# Patient Record
Sex: Male | Born: 1960 | Race: White | Hispanic: No | Marital: Married | Smoking: Never smoker
Health system: Southern US, Community
[De-identification: ages and names within clinical notes are randomized; demographics above are authoritative.]

## PROBLEM LIST (undated history)

## (undated) DIAGNOSIS — I10 Essential (primary) hypertension: Secondary | ICD-10-CM

## (undated) DIAGNOSIS — E7800 Pure hypercholesterolemia, unspecified: Secondary | ICD-10-CM

## (undated) DIAGNOSIS — I2510 Atherosclerotic heart disease of native coronary artery without angina pectoris: Secondary | ICD-10-CM

## (undated) DIAGNOSIS — E785 Hyperlipidemia, unspecified: Secondary | ICD-10-CM

## (undated) HISTORY — DX: Hyperlipidemia, unspecified: E78.5

## (undated) HISTORY — DX: Atherosclerotic heart disease of native coronary artery without angina pectoris: I25.10

---

## 2016-04-15 ENCOUNTER — Institutional Professional Consult (permissible substitution): Payer: Self-pay | Admitting: Internal Medicine

## 2016-05-05 ENCOUNTER — Encounter: Payer: Self-pay | Admitting: Internal Medicine

## 2016-05-05 ENCOUNTER — Ambulatory Visit (INDEPENDENT_AMBULATORY_CARE_PROVIDER_SITE_OTHER): Payer: Managed Care, Other (non HMO) | Admitting: Internal Medicine

## 2016-05-05 VITALS — BP 124/88 | HR 105 | Ht 66.0 in | Wt 178.8 lb

## 2016-05-05 DIAGNOSIS — R053 Chronic cough: Secondary | ICD-10-CM

## 2016-05-05 DIAGNOSIS — R05 Cough: Secondary | ICD-10-CM

## 2016-05-05 NOTE — Patient Instructions (Addendum)
Pantoprazole (protonix) 40 mg   Take  30-60 min before first meal of the day and Pepcid (famotidine)  20 mg one @  bedtime until gone for a week   GERD (REFLUX)  is an extremely common cause of respiratory symptoms just like yours , many times with no obvious heartburn at all.    It can be treated with medication, but also with lifestyle changes including elevation of the head of your bed (ideally with 6 inch  bed blocks),  Smoking cessation, avoidance of late meals, excessive alcohol, and avoid fatty foods, chocolate, peppermint, colas, red wine, and acidic juices such as orange juice.  NO MINT OR MENTHOL PRODUCTS SO NO COUGH DROPS  USE SUGARLESS CANDY INSTEAD (Jolley ranchers or Stover's or Life Savers) or even ice chips will also do - the key is to swallow to prevent all throat clearing. NO OIL BASED VITAMINS - use powdered substitutes.    Please see patient coordinator before you leave today  to schedule HRCT of chest Despina Hick/sinus

## 2016-05-05 NOTE — Progress Notes (Signed)
   Subjective:    Patient ID: Javier Rowland, male    DOB: 08-Jul-1961,    MRN: [id number]  HPI  [age] yowm never smoker  [place] ran middle of the [place] and always passed sp[facility]rom[facility] resp[facility] but developed chronic cough in 2016 with tendency to "bad cold" with crackles on exam per Javier Rowland Rowland who referred pt to pulmonary clinic 05/05/2016    05/05/2016 1st [facility] office visit/ Javier Rowland   Chief Complaint  Patient presents with  . Pulmonary Consult    Referred by Dr. Kateri PlummerMorrow for eval of abnormal lung sounds. Pt states that his PCP notices a "rattle" on exam. Pt states he has had a cough for the past 3-4 wks. His spouse reports that his cough has been present for longer than 3-4 wks.   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  Cough / throat clearing more day than noct/ sporadic but present most days x one year per spouse   No obvious day to day or daytime variability or assoc excess/ purulent sputum or mucus plugs or hemoptysis or cp or chest tightness, subjective wheeze or overt sinus or hb symptoms. No unusual exp hx or h/o childhood pna/ asthma or knowledge of premature birth.  Sleeping ok without nocturnal  or early am exacerbation  of respiratory  c/o's or need for noct saba. Also denies any obvious fluctuation of symptoms with weather or environmental changes or other aggravating or alleviating factors except as outlined above   Current Medications, Allergies, Complete Past Medical History, Past Surgical History, Family History, and Social History were reviewed in [facility] electronic medical record.        Review of Systems  Constitutional: Negative for fever, chills, activity change, appetite change and unexpected weight change.  HENT: Negative for congestion, dental problem, postnasal drip,  rhinorrhea, sneezing, sore throat, trouble swallowing and voice change.   Eyes: Negative for visual disturbance.  Respiratory: Positive for cough. Negative for choking and shortness of breath.   Cardiovascular: Negative for chest pain and leg swelling.  Gastrointestinal: Negative for nausea, vomiting and abdominal pain.  Genitourinary: Negative for difficulty urinating.  Musculoskeletal: Negative for arthralgias.  Skin: Negative for rash.  Psychiatric/Behavioral: Negative for behavioral problems and confusion.       Objective:   Physical Exam  amb hoarse wm nad   Wt Readings from Last 3 Encounters:  05/05/16 178 lb 12.8 oz (81.103 kg)    Vital signs reviewed   HEENT: nl dentition, turbinates, and oropharynx. Nl external ear canals without cough reflex   NECK :  without JVD/Nodes/TM/ nl carotid upstrokes bilaterally   LUNGS: no acc muscle use,  Nl contour chest with subtle late insp crackles  bilaterally without cough on insp or exp maneuvers   CV:  RRR  no s3 or murmur or increase in P2, no edema   ABD:  soft and nontender with nl inspiratory excursion in the supine position. No bruits or organomegaly, bowel sounds nl  MS:  Nl gait/ ext warm without deformities, calf tenderness, cyanosis or clubbing No obvious joint restrictions   SKIN: warm and dry without lesions    NEURO:  alert, approp, nl sensorium with  no motor deficits            Assessment & Plan:

## 2016-05-06 ENCOUNTER — Telehealth: Payer: Self-pay | Admitting: Internal Medicine

## 2016-05-06 MED ORDER — PANTOPRAZOLE SODIUM 40 MG PO TBEC: 40.0000 mg | DELAYED_RELEASE_TABLET | Freq: Every day | ORAL | Status: DC

## 2016-05-06 MED ORDER — FAMOTIDINE 20 MG PO TABS: 20.0000 mg | ORAL_TABLET | Freq: Two times a day (BID) | ORAL | Status: DC

## 2016-05-06 NOTE — Telephone Encounter (Signed)
Sue Lushndrea called back and requested that we also cancel the original appointment for the CT scan on the patient's behalf. She tried to cancel and she was on hold for 30 minutes.-prm

## 2016-05-06 NOTE — Telephone Encounter (Signed)
CT orders have been faxed to [facility]. Spoke with Kennyth ArnoldStacy at [facility] and these appointments have been canceled. Nothing further was needed.

## 2016-05-06 NOTE — Telephone Encounter (Signed)
Spoke with pt. The number we have on file for pt's wife is incorrect. Advised pt that we will send in these prescriptions for him. Nothing further was needed.

## 2016-05-06 NOTE — Assessment & Plan Note (Addendum)
Assoc with subtle crackles on exam is worrisome for ILD and note he has had some asbestos exp in past but this was really minimal and the cough is not reproduced on insp which favors Upper airway cough syndrome, so named because it's frequently impossible to sort out how much is  CR/sinusitis with freq throat clearing (which can be related to primary GERD)   vs  causing  secondary (" extra esophageal")  GERD from wide swings in gastric pressure that occur with throat clearing, often  promoting self use of mint and menthol lozenges that reduce the lower esophageal sphincter tone and exacerbate the problem further in a cyclical fashion.   These are the same pts (now being labeled as having "irritable larynx syndrome" by some cough centers) who not infrequently have a history of having failed to tolerate ace inhibitors,  dry powder inhalers or biphosphonates or report having atypical reflux symptoms that don't respond to standard doses of PPI , and are easily confused as having aecopd or asthma flares by even experienced allergists/ pulmonologists.   Will rec rx for gerd and w/u for sinus dz/ILD with ct of chest and sinus  Then regroup with full pfts if ild is present or cough not gone with gerd rx  Total time devoted to counseling  = 35/7469m review case with pt/ discussion of options/alternatives/ personally creating written instructions  in presence of pt  then going over those specific  Instructions directly with the pt including how to use all of the meds but in particular covering each new medication in detail and the difference between the maintenance/automatic meds and the prns using an action plan format for the latter.

## 2016-05-13 ENCOUNTER — Other Ambulatory Visit: Payer: Managed Care, Other (non HMO)

## 2016-05-13 ENCOUNTER — Inpatient Hospital Stay: Admission: RE | Admit: 2016-05-13 | Payer: Managed Care, Other (non HMO) | Source: Ambulatory Visit

## 2016-05-21 ENCOUNTER — Telehealth: Payer: Self-pay | Admitting: Internal Medicine

## 2016-05-21 NOTE — Telephone Encounter (Signed)
Pt calling for CT results. [facility] [place] [facility] [place]. This was done on 05/13/16.  Please advise Verlon AuLeslie if you have seen any results on this CT - if not we will have to call and have them fax these. Thanks.

## 2016-05-22 NOTE — Telephone Encounter (Signed)
No, I have not seen these results

## 2016-05-22 NOTE — Telephone Encounter (Signed)
Called [facility] in [place] and spoke with [place] . Records are being faxed to [phone]. She voiced understanding and had no further questions .

## 2016-05-25 NOTE — Telephone Encounter (Signed)
Dr. Sherene SiresWert, have you received CT results on this patient? They were to be refaxed to us on 05/22/16.  Please advise.

## 2016-05-25 NOTE — Telephone Encounter (Signed)
Spoke with pt's wife. She is aware of results. Nothing further was needed. 

## 2016-05-25 NOTE — Telephone Encounter (Signed)
I did see them and they are unremarkable, needs f/u if not doing better with all active meds in hand

## 2016-06-03 ENCOUNTER — Encounter: Payer: Self-pay | Admitting: Internal Medicine

## 2017-11-01 ENCOUNTER — Encounter (HOSPITAL_COMMUNITY): Payer: Self-pay | Admitting: Emergency Medicine

## 2017-11-01 ENCOUNTER — Other Ambulatory Visit: Payer: Self-pay

## 2017-11-01 ENCOUNTER — Emergency Department (HOSPITAL_COMMUNITY): Payer: Managed Care, Other (non HMO)

## 2017-11-01 ENCOUNTER — Inpatient Hospital Stay (HOSPITAL_COMMUNITY)
Admission: EM | Admit: 2017-11-01 | Discharge: 2017-11-03 | DRG: 247 | Disposition: A | Discharge status: Home / Self Care | Payer: Managed Care, Other (non HMO) | Attending: Internal Medicine | Admitting: Internal Medicine

## 2017-11-01 DIAGNOSIS — I10 Essential (primary) hypertension: Secondary | ICD-10-CM | POA: Diagnosis present

## 2017-11-01 DIAGNOSIS — I2510 Atherosclerotic heart disease of native coronary artery without angina pectoris: Secondary | ICD-10-CM | POA: Diagnosis present

## 2017-11-01 DIAGNOSIS — Z8249 Family history of ischemic heart disease and other diseases of the circulatory system: Secondary | ICD-10-CM

## 2017-11-01 DIAGNOSIS — Z955 Presence of coronary angioplasty implant and graft: Secondary | ICD-10-CM | POA: Diagnosis not present

## 2017-11-01 DIAGNOSIS — E785 Hyperlipidemia, unspecified: Secondary | ICD-10-CM | POA: Diagnosis present

## 2017-11-01 DIAGNOSIS — Z79899 Other long term (current) drug therapy: Secondary | ICD-10-CM

## 2017-11-01 DIAGNOSIS — R05 Cough: Secondary | ICD-10-CM | POA: Diagnosis present

## 2017-11-01 DIAGNOSIS — K219 Gastro-esophageal reflux disease without esophagitis: Secondary | ICD-10-CM | POA: Diagnosis present

## 2017-11-01 DIAGNOSIS — Z7982 Long term (current) use of aspirin: Secondary | ICD-10-CM

## 2017-11-01 DIAGNOSIS — I214 Non-ST elevation (NSTEMI) myocardial infarction: Secondary | ICD-10-CM | POA: Diagnosis present

## 2017-11-01 HISTORY — DX: Essential (primary) hypertension: I10

## 2017-11-01 HISTORY — DX: Pure hypercholesterolemia, unspecified: E78.00

## 2017-11-01 LAB — CBC
HEMATOCRIT: 44 % (ref 39.0–52.0)
Hemoglobin: 15 g/dL (ref 13.0–17.0)
MCH: 30.9 pg (ref 26.0–34.0)
MCHC: 34.1 g/dL (ref 30.0–36.0)
MCV: 90.7 fL (ref 78.0–100.0)
Platelets: 241 10*3/uL (ref 150–400)
RBC: 4.85 MIL/uL (ref 4.22–5.81)
RDW: 12.5 % (ref 11.5–15.5)
WBC: 8 10*3/uL (ref 4.0–10.5)

## 2017-11-01 LAB — BASIC METABOLIC PANEL
Anion gap: 9 (ref 5–15)
BUN: 15 mg/dL (ref 6–20)
CHLORIDE: 104 mmol/L (ref 101–111)
CO2: 26 mmol/L (ref 22–32)
CREATININE: 0.99 mg/dL (ref 0.61–1.24)
Calcium: 8.9 mg/dL (ref 8.9–10.3)
GFR calc Af Amer: >60 mL/min (ref >60)
GFR calc non Af Amer: >60 mL/min (ref >60)
Glucose, Bld: 94 mg/dL (ref 65–99)
POTASSIUM: 3.7 mmol/L (ref 3.5–5.1)
SODIUM: 139 mmol/L (ref 135–145)

## 2017-11-01 LAB — PROTIME-INR
INR: 0.96
Prothrombin Time: 12.7 seconds (ref 11.4–15.2)

## 2017-11-01 LAB — I-STAT TROPONIN, ED: Troponin i, poc: 0.5 ng/mL (ref 0.00–0.08)

## 2017-11-01 LAB — APTT: APTT: 31 s (ref 24–36)

## 2017-11-01 MED ORDER — HEPARIN (PORCINE) IN NACL 100-0.45 UNIT/ML-% IJ SOLN
900.0000 [IU]/h | INTRAMUSCULAR | Status: DC
  Administered 2017-11-01: 900 [IU]/h via INTRAVENOUS
  Filled 2017-11-01: qty 250

## 2017-11-01 MED ORDER — ASPIRIN 81 MG PO CHEW
324.0000 mg | CHEWABLE_TABLET | Freq: Once | ORAL | Status: AC
  Administered 2017-11-01: 324 mg via ORAL
  Filled 2017-11-01: qty 4

## 2017-11-01 MED ORDER — HEPARIN BOLUS VIA INFUSION
4000.0000 [IU] | INTRAVENOUS | Status: AC
  Administered 2017-11-01: 4000 [IU] via INTRAVENOUS
  Filled 2017-11-01: qty 4000

## 2017-11-01 NOTE — ED Notes (Signed)
Bed: WA15 Expected date:  Expected time:  Means of arrival:  Comments: hold 

## 2017-11-01 NOTE — ED Triage Notes (Signed)
Pt c/o intermittent chest pain x 3 weeks, pt states he has chest pain 3-4 times per day and states he has bilateral upper extremity pain associated with the chest pain. Pt describes the chest pain as sharp shooting pains across his chest from R axilla to L axilla. Pt denies SOB. Pt has taken ibuprofen for pain, takes ASA daily.

## 2017-11-01 NOTE — H&P (Signed)
ADMISSION HISTORY & PHYSICAL  Patient Name: Javier Rowland Date of Encounter: 11/01/2017 Cardiologist: None (NEW)  Chief Complaint   Chest pain  Patient Profile   56 yo male with a history of HTN, dyslipidemia and GERD, presents with several weeks of intermittnet SSCP lasting <5 minutes, found to have an elevated troponin.  HPI   Javier Rowland is a 56 y.o. male who is being seen today for the evaluation of chest pain at the request of Dr. Zenia Resides. This is a 56 yo male with a history of HTN, dyslipidemia and GERD, who presents with several weeks of intermittnet SSCP lasting <5 minutes. He works in maintenance and reports that approximately 3 weeks ago he had an episode of intense substernal chest pain which radiated to both arms and back to his back.  He said it lasted for about 5-10 minutes and then improved.  He distinctly thought it may be a heart attack however he said he is "ready to die any time".  He seemed more concerned about the cost of healthcare, but made it a point that his wife brought him into the emergency department as he is continue to have intermittent episodes of chest pain.  On admission, blood work showed an elevated troponin of 0.5.  EKG was personally reviewed and shows borderline anterolateral ST elevation with T wave inversions suggestive of subacute anterior MI.  Currently he is chest pain-free.  He denies any shortness of breath or decrease in exercise tolerance.  PMHx   Past Medical History:  Diagnosis Date  . HTN (hypertension) 11/01/2017  . Hypercholesterolemia   . Hyperlipidemia     History reviewed. No pertinent surgical history.  FAMHx   Family History  Problem Relation Age of Onset  . Allergies Daughter   . Heart disease Maternal Grandfather   . Colon cancer Father     SOCHx    reports that  has never smoked. he has never used smokeless tobacco. He reports that he does not drink alcohol or use drugs.  Outpatient Medications   No current  facility-administered medications on file prior to encounter.    Current Outpatient Medications on File Prior to Encounter  Medication Sig Dispense Refill  . aspirin 81 MG tablet Take 81 mg by mouth daily.    Marland Kitchen ibuprofen (ADVIL,MOTRIN) 200 MG tablet Take 400 mg by mouth every 6 (six) hours as needed for moderate pain.    . naproxen sodium (ALEVE) 220 MG tablet Take 440 mg by mouth 2 (two) times daily as needed (pain).    . pantoprazole (PROTONIX) 40 MG tablet Take 1 tablet (40 mg total) by mouth daily. (Patient taking differently: Take 40 mg by mouth daily as needed (acid reflux.). ) 30 tablet 5  . simvastatin (ZOCOR) 20 MG tablet Take 20 mg by mouth daily.  2  . famotidine (PEPCID) 20 MG tablet Take 1 tablet (20 mg total) by mouth 2 (two) times daily. (Patient not taking: Reported on 11/01/2017) 30 tablet 5    Inpatient Medications    Scheduled Meds:   Continuous Infusions:   PRN Meds:    ALLERGIES   No Known Allergies  ROS   Pertinent items noted in HPI and remainder of comprehensive ROS otherwise negative.  Vitals   Vitals:   11/01/17 1249 11/01/17 1255 11/01/17 1256  BP: 114/82    Pulse: 70    Resp: 18    Temp: 97.6 F (36.4 C)    TempSrc: Oral    SpO2: 99%  100%   Weight:   165 lb (74.8 kg)  Height:   _0  (1.676 m)   No intake or output data in the 24 hours ending 11/01/17 1453 Filed Weights   11/01/17 1256  Weight: 165 lb (74.8 kg)    Physical Exam   General appearance: alert, no distress and Thin Neck: no carotid bruit, no JVD and thyroid not enlarged, symmetric, no tenderness/mass/nodules Lungs: clear to auscultation bilaterally Heart: regular rate and rhythm, S1, S2 normal, no murmur, click, rub or gallop Abdomen: soft, non-tender; bowel sounds normal; no masses,  no organomegaly Extremities: extremities normal, atraumatic, no cyanosis or edema Pulses: 2+ and symmetric Skin: Pale, warm, dry Neurologic: Grossly normal Psych: Pleasant  Labs    Results for orders placed or performed during the hospital encounter of 11/01/17 (from the past 48 hour(s))  Basic metabolic panel     Status: None   Collection Time: 11/01/17  1:05 PM  Result Value Ref Range   Sodium 139 135 - 145 mmol/L   Potassium 3.7 3.5 - 5.1 mmol/L   Chloride 104 101 - 111 mmol/L   CO2 26 22 - 32 mmol/L   Glucose, Bld 94 65 - 99 mg/dL   BUN 15 6 - 20 mg/dL   Creatinine, Ser 0.99 0.61 - 1.24 mg/dL   Calcium 8.9 8.9 - 10.3 mg/dL   GFR calc non Af Amer >60 >60 mL/min   GFR calc Af Amer >60 >60 mL/min    Comment: (NOTE) The eGFR has been calculated using the CKD EPI equation. This calculation has not been validated in all clinical situations. eGFR's persistently <60 mL/min signify possible Chronic Kidney Disease.    Anion gap 9 5 - 15  CBC     Status: None   Collection Time: 11/01/17  1:05 PM  Result Value Ref Range   WBC 8.0 4.0 - 10.5 K/uL   RBC 4.85 4.22 - 5.81 MIL/uL   Hemoglobin 15.0 13.0 - 17.0 g/dL   HCT 44.0 39.0 - 52.0 %   MCV 90.7 78.0 - 100.0 fL   MCH 30.9 26.0 - 34.0 pg   MCHC 34.1 30.0 - 36.0 g/dL   RDW 12.5 11.5 - 15.5 %   Platelets 241 150 - 400 K/uL  I-stat troponin, ED     Status: Abnormal   Collection Time: 11/01/17  1:17 PM  Result Value Ref Range   Troponin i, poc 0.50 (HH) 0.00 - 0.08 ng/mL   Comment NOTIFIED PHYSICIAN    Comment 3            Comment: Due to the release kinetics of cTnI, a negative result within the first hours of the onset of symptoms does not rule out myocardial infarction with certainty. If myocardial infarction is still suspected, repeat the test at appropriate intervals.     ECG   Sinus rhythm at 71 with anteroseptal infarct pattern, indeterminate age - lateral TWI's - Personally Reviewed  Telemetry   Sinus rhythm - Personally Reviewed  Radiology   No results found.  Cardiac Studies   N/A  Impression   Principal Problem:   NSTEMI (non-ST elevated myocardial infarction) ([place]) Active  Problems:   HTN (hypertension)   Dyslipidemia   GERD (gastroesophageal reflux disease)   Recommendation   1. Mr. Veley may have had a missed ST elevation MI a couple of weeks ago and has had stuttering chest pain symptoms, with subacute anterolateral EKG changes suggestive of LAD ischemia.  Troponin was elevated on  admission.  He is currently chest pain-free.  I am recommending heart catheterization.  He will need transfer to [facility] and we will plan to arrange it for tomorrow. Start IV heparin - continue aspirin, add low-dose b-blocker and change simvastatin 20 mg to atorvastatin 80 mg daily. Ok to eat today - keep NPO p MN for cath tomorrow.  Time Spent Directly with Patient:  I have spent a total of 45 minutes with the patient reviewing hospital notes, telemetry, EKGs, labs and examining the patient as well as establishing an assessment and plan that was discussed personally with the patient. > 50% of time was spent in direct patient care.  Length of Stay:  LOS: 0 days   Pixie Casino, MD, [facility], [place] Director of the Advanced Lipid Disorders &  Cardiovascular Risk Reduction Clinic Attending Cardiologist  Direct Dial: [phone]  Fax: [phone]  Website:  [email or web address] Osgood Rowland Ericsson 11/01/2017, 2:53 PM

## 2017-11-01 NOTE — ED Notes (Signed)
[facility] contacted for transport to [place]  °

## 2017-11-01 NOTE — Interval H&P Note (Signed)
Cath Lab Visit (complete for each Cath Lab visit)  Clinical Evaluation Leading to the Procedure:   ACS: Yes.    Non-ACS:    Anginal Classification: CCS III  Anti-ischemic medical therapy: Minimal Therapy (1 class of medications)  Non-Invasive Test Results: No non-invasive testing performed  Prior CABG: No previous CABG      History and Physical Interval Note:  11/01/2017 8:24 PM  Javier Rowland  has presented today for surgery, with the diagnosis of n stemi  The various methods of treatment have been discussed with the patient and family. After consideration of risks, benefits and other options for treatment, the patient has consented to  Procedure(s): LEFT HEART CATH AND CORONARY ANGIOGRAPHY (N/A) as a surgical intervention .  The patient's history has been reviewed, patient examined, no change in status, stable for surgery.  I have reviewed the patient's chart and labs.  Questions were answered to the patient's satisfaction.     Lyn RecordsHenry W Smith III

## 2017-11-01 NOTE — ED Notes (Signed)
[id number] Ricky RN unable to take report at this time.

## 2017-11-01 NOTE — Progress Notes (Addendum)
ANTICOAGULATION CONSULT NOTE - Initial Consult  Pharmacy Consult for IV heparin Indication: chest pain/NSTEMI  No Known Allergies  Patient Measurements: Height: 5\' 6"  (167.6 cm) Weight: 165 lb (74.8 kg) IBW/kg (Calculated) : 63.8 Heparin Dosing Weight: total body weight  Vital Signs: Temp: 97.6 F (36.4 C) (12/03 1249) Temp Source: Oral (12/03 1249) BP: 126/100 (12/03 1430) Pulse Rate: 69 (12/03 1430)  Labs: Recent Labs    11/01/17 1305  HGB 15.0  HCT 44.0  PLT 241  CREATININE 0.99    Estimated Creatinine Clearance: 74.3 mL/min (by C-G formula based on SCr of 0.99 mg/dL).   Medical History: Past Medical History:  Diagnosis Date  . HTN (hypertension) 11/01/2017  . Hypercholesterolemia   . Hyperlipidemia     Assessment: [age] y/oM with PMH of HTN, HLD, GERD who [facility] with several weeks of intermittent chest pain. Troponin elevated. Pharmacy consulted by cardiology to start heparin infusion for NSTEMI. Patient not on any anticoagulants PTA. CBC WNL. Baseline PT/INR, aPTT WNL.    Goal of Therapy:  Heparin level 0.3-0.7 units/ml Monitor platelets by anticoagulation protocol: Yes   Plan:   Heparin 4000 units IV bolus x 1, then start heparin infusion at 900 units/hr.  Heparin level 6 hours after infusion started.   Daily heparin level and CBC while on heparin infusion.  Noted plans for cardiac cath tomorrow-heparin infusion expected to be stopped on call to cath lab.    Greer PickerelJigna Fonnie Crookshanks, PharmD, BCPS Pager: [phone] 11/01/2017 4:15 PM

## 2017-11-01 NOTE — ED Provider Notes (Signed)
[facility]-EMERGENCY DEPT Provider Note   CSN: [id number] Arrival date & time: 11/01/17  1240     History   Chief Complaint Chief Complaint  Patient presents with  . Chest Pain  . Arm Pain    HPI Javier Rowland is a 56 y.o. male.  56 year old male presents with 3 weeks of intermittent substernal chest pain lasting between 3 and 5 minutes.  Pain not associated with diaphoresis or dyspnea.  Pain radiated to both arms.  No exertional component to this.  No recent fever chills or cough.  Pain resolved spontaneously at times and he is also use ibuprofen.  Patient states that he has not had increasing episodes of this and states he has this about 3 times a day.  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  Is here today at the encouragement of his wife      Past Medical History:  Diagnosis Date  . Hypercholesterolemia   . Hyperlipidemia     Patient Active Problem List   Diagnosis Date Noted  . Chronic cough 05/05/2016    History reviewed. No pertinent surgical history.     Home Medications    Prior to Admission medications   Medication Sig Start Date End Date Taking? Authorizing Provider  aspirin 81 MG tablet Take 81 mg by mouth daily.    [provider]  famotidine (PEPCID) 20 MG tablet Take 1 tablet (20 mg total) by mouth 2 (two) times daily. 05/06/16   Nyoka CowdenWert, Michael B, MD  pantoprazole (PROTONIX) 40 MG tablet Take 1 tablet (40 mg total) by mouth daily. 05/06/16   Nyoka CowdenWert, Michael B, MD  PROAIR HFA 108 [phone](90 Base) MCG/ACT inhaler INHALE 1 TO 2 PUFFS INTO THE LUNGS EVERY 4 TO 6 HOURSE AS NEEDED 04/21/16   [provider]    Family History Family History  Problem Relation Age of Onset  . Allergies Daughter   . Heart disease Maternal Grandfather   . Colon cancer Father     Social History Social History   Tobacco Use  . Smoking status: Never Smoker  . Smokeless tobacco: Never Used  Substance Use Topics  . Alcohol use: No   Alcohol/week: 0.0 oz  . Drug use: No     Allergies   Patient has no known allergies.   Review of Systems Review of Systems  All other systems reviewed and are negative.    Physical Exam Updated Vital Signs BP 114/82 (BP Location: Left Arm)   Pulse 70   Temp 97.6 F (36.4 C) (Oral)   Resp 18   Ht 1.676 m (5\' 6" )   Wt 74.8 kg (165 lb)   SpO2 100%   BMI 26.63 kg/m   Physical Exam  Constitutional: He is oriented to person, place, and time. He appears well-developed and well-nourished.  Non-toxic appearance. No distress.  HENT:  Head: Normocephalic and atraumatic.  Eyes: Conjunctivae, EOM and lids are normal. Pupils are equal, round, and reactive to light.  Neck: Normal range of motion. Neck supple. No tracheal deviation present. No thyroid mass present.  Cardiovascular: Normal rate, regular rhythm and normal heart sounds. Exam reveals no gallop.  No murmur heard. Pulmonary/Chest: Effort normal and breath sounds normal. No stridor. No respiratory distress. He has no decreased breath sounds. He has no wheezes. He has no rhonchi. He has no rales.  Abdominal: Soft. Normal appearance and bowel sounds are normal. He exhibits no distension. There is no tenderness. There is no rebound and  no CVA tenderness.  Musculoskeletal: Normal range of motion. He exhibits no edema or tenderness.  Neurological: He is alert and oriented to person, place, and time. He has normal strength. No cranial nerve deficit or sensory deficit. GCS eye subscore is 4. GCS verbal subscore is 5. GCS motor subscore is 6.  Skin: Skin is warm and dry. No abrasion and no rash noted.  Psychiatric: He has a normal mood and affect. His speech is normal and behavior is normal.  Nursing note and vitals reviewed.    ED Treatments / Results  Labs (all labs ordered are listed, but only abnormal results are displayed) Labs Reviewed  I-STAT TROPONIN, ED - Abnormal; Notable for the following components:      Result Value     Troponin i, poc 0.50 (*)    All other components within normal limits  BASIC METABOLIC PANEL  CBC    EKG  EKG Interpretation  Date/Time:  Monday November 01 2017 12:47:50 EST Ventricular Rate:  71 PR Interval:    QRS Duration: 96 QT Interval:  368 QTC Calculation: 400 R Axis:   -11 Text Interpretation:  Sinus rhythm Anteroseptal infarct, age indeterminate No old tracing to compare Confirmed by Lorre NickAllen, Emmie Frakes ([id number]) on 11/01/2017 1:35:16 PM       Radiology No results found.  Procedures Procedures (including critical care time)  Medications Ordered in ED Medications - No data to display   Initial Impression / Assessment and Plan / ED Course  I have reviewed the triage vital signs and the nursing notes.  Pertinent labs & imaging results that were available during my care of the patient were reviewed by me and considered in my medical decision making (see chart for details).     Patient given aspirin here.  He is currently pain-free at this time.  Consult hospitalist for admission  Final Clinical Impressions(s) / ED Diagnoses   Final diagnoses:  None    ED Discharge Orders    None       Lorre NickAllen, Minka Knight, MD 11/01/17 1356

## 2017-11-01 NOTE — ED Notes (Signed)
Patient transported to X-ray 

## 2017-11-02 ENCOUNTER — Encounter (HOSPITAL_COMMUNITY)
Admission: EM | Disposition: A | Discharge status: Home / Self Care | Payer: Self-pay | Source: Home / Self Care | Attending: Internal Medicine

## 2017-11-02 DIAGNOSIS — I10 Essential (primary) hypertension: Secondary | ICD-10-CM

## 2017-11-02 DIAGNOSIS — E785 Hyperlipidemia, unspecified: Secondary | ICD-10-CM

## 2017-11-02 HISTORY — PX: CORONARY STENT INTERVENTION: CATH118234

## 2017-11-02 HISTORY — PX: LEFT HEART CATH AND CORONARY ANGIOGRAPHY: CATH118249

## 2017-11-02 LAB — BASIC METABOLIC PANEL
ANION GAP: 8 (ref 5–15)
BUN: 13 mg/dL (ref 6–20)
CHLORIDE: 104 mmol/L (ref 101–111)
CO2: 25 mmol/L (ref 22–32)
CREATININE: 1.01 mg/dL (ref 0.61–1.24)
Calcium: 8.7 mg/dL — ABNORMAL LOW (ref 8.9–10.3)
GFR calc non Af Amer: >60 mL/min (ref >60)
Glucose, Bld: 88 mg/dL (ref 65–99)
Potassium: 4 mmol/L (ref 3.5–5.1)
SODIUM: 137 mmol/L (ref 135–145)

## 2017-11-02 LAB — CBC
HEMATOCRIT: 44.9 % (ref 39.0–52.0)
Hemoglobin: 15.2 g/dL (ref 13.0–17.0)
MCH: 30.8 pg (ref 26.0–34.0)
MCHC: 33.9 g/dL (ref 30.0–36.0)
MCV: 91.1 fL (ref 78.0–100.0)
PLATELETS: 251 10*3/uL (ref 150–400)
RBC: 4.93 MIL/uL (ref 4.22–5.81)
RDW: 12.7 % (ref 11.5–15.5)
WBC: 8.7 10*3/uL (ref 4.0–10.5)

## 2017-11-02 LAB — LIPID PANEL
Cholesterol: 133 mg/dL (ref 0–200)
HDL: 43 mg/dL (ref >40)
LDL CALC: 71 mg/dL (ref 0–99)
TRIGLYCERIDES: 97 mg/dL (ref <150)
Total CHOL/HDL Ratio: 3.1 RATIO
VLDL: 19 mg/dL (ref 0–40)

## 2017-11-02 LAB — HEPARIN LEVEL (UNFRACTIONATED)
HEPARIN UNFRACTIONATED: 0.4 [IU]/mL (ref 0.30–0.70)
Heparin Unfractionated: 0.4 IU/mL (ref 0.30–0.70)

## 2017-11-02 LAB — POCT ACTIVATED CLOTTING TIME
ACTIVATED CLOTTING TIME: 543 s
ACTIVATED CLOTTING TIME: 252 s

## 2017-11-02 LAB — TROPONIN I: TROPONIN I: 0.51 ng/mL — AB (ref <0.03)

## 2017-11-02 LAB — HIV ANTIBODY (ROUTINE TESTING W REFLEX): HIV Screen 4th Generation wRfx: NONREACTIVE

## 2017-11-02 SURGERY — LEFT HEART CATH AND CORONARY ANGIOGRAPHY
Anesthesia: LOCAL

## 2017-11-02 MED ORDER — SODIUM CHLORIDE 0.9% FLUSH: 3.0000 mL | INTRAVENOUS | Status: DC | PRN

## 2017-11-02 MED ORDER — NITROGLYCERIN 1 MG/10 ML FOR IR/CATH LAB
INTRA_ARTERIAL | Status: AC
  Filled 2017-11-02: qty 10

## 2017-11-02 MED ORDER — HEPARIN SODIUM (PORCINE) 5000 UNIT/ML IJ SOLN
5000.0000 [IU] | Freq: Three times a day (TID) | INTRAMUSCULAR | Status: DC
  Administered 2017-11-03: 05:00:00 5000 [IU] via SUBCUTANEOUS
  Filled 2017-11-02: qty 1

## 2017-11-02 MED ORDER — HEPARIN SODIUM (PORCINE) 1000 UNIT/ML IJ SOLN
INTRAMUSCULAR | Status: AC
  Filled 2017-11-02: qty 1

## 2017-11-02 MED ORDER — HYDRALAZINE HCL 20 MG/ML IJ SOLN: 5.0000 mg | INTRAMUSCULAR | Status: AC | PRN

## 2017-11-02 MED ORDER — OXYCODONE HCL 5 MG PO TABS: 5.0000 mg | ORAL_TABLET | ORAL | Status: DC | PRN

## 2017-11-02 MED ORDER — MIDAZOLAM HCL 2 MG/2ML IJ SOLN
INTRAMUSCULAR | Status: DC | PRN
  Administered 2017-11-02 (×2): 1 mg via INTRAVENOUS

## 2017-11-02 MED ORDER — LIDOCAINE HCL (PF) 1 % IJ SOLN
INTRAMUSCULAR | Status: DC | PRN
  Administered 2017-11-02: 2 mL

## 2017-11-02 MED ORDER — HEPARIN (PORCINE) IN NACL 2-0.9 UNIT/ML-% IJ SOLN
INTRAMUSCULAR | Status: AC | PRN
  Administered 2017-11-02: 1000 mL

## 2017-11-02 MED ORDER — ACETAMINOPHEN 325 MG PO TABS
650.0000 mg | ORAL_TABLET | ORAL | Status: DC | PRN
  Administered 2017-11-02: 18:00:00 650 mg via ORAL

## 2017-11-02 MED ORDER — ASPIRIN 81 MG PO CHEW
81.0000 mg | CHEWABLE_TABLET | Freq: Every day | ORAL | Status: DC
Start: 2017-11-02 — End: 2017-11-02
  Administered 2017-11-02: 07:00:00 81 mg via ORAL
  Filled 2017-11-02: qty 1

## 2017-11-02 MED ORDER — NITROGLYCERIN 1 MG/10 ML FOR IR/CATH LAB
INTRA_ARTERIAL | Status: DC | PRN
  Administered 2017-11-02: 200 ug via INTRACORONARY

## 2017-11-02 MED ORDER — SODIUM CHLORIDE 0.9 % WEIGHT BASED INFUSION: 1.3000 mL/kg/h | INTRAVENOUS | Status: AC

## 2017-11-02 MED ORDER — VERAPAMIL HCL 2.5 MG/ML IV SOLN
INTRAVENOUS | Status: DC | PRN
  Administered 2017-11-02: 10 mL via INTRA_ARTERIAL

## 2017-11-02 MED ORDER — SODIUM CHLORIDE 0.9 % IV SOLN: 250.0000 mL | INTRAVENOUS | Status: DC | PRN

## 2017-11-02 MED ORDER — SODIUM CHLORIDE 0.9% FLUSH
3.0000 mL | Freq: Two times a day (BID) | INTRAVENOUS | Status: DC
  Administered 2017-11-02: 3 mL via INTRAVENOUS

## 2017-11-02 MED ORDER — ONDANSETRON HCL 4 MG/2ML IJ SOLN: 4.0000 mg | Freq: Four times a day (QID) | INTRAMUSCULAR | Status: DC | PRN

## 2017-11-02 MED ORDER — MIDAZOLAM HCL 2 MG/2ML IJ SOLN
INTRAMUSCULAR | Status: AC
  Filled 2017-11-02: qty 2

## 2017-11-02 MED ORDER — HEPARIN (PORCINE) IN NACL 2-0.9 UNIT/ML-% IJ SOLN
INTRAMUSCULAR | Status: AC
  Filled 2017-11-02: qty 1000

## 2017-11-02 MED ORDER — SODIUM CHLORIDE 0.9% FLUSH
3.0000 mL | Freq: Two times a day (BID) | INTRAVENOUS | Status: DC
  Administered 2017-11-02: 13:00:00 3 mL via INTRAVENOUS

## 2017-11-02 MED ORDER — TICAGRELOR 90 MG PO TABS
ORAL_TABLET | ORAL | Status: DC | PRN
  Administered 2017-11-02: 180 mg via ORAL

## 2017-11-02 MED ORDER — FENTANYL CITRATE (PF) 100 MCG/2ML IJ SOLN
INTRAMUSCULAR | Status: DC | PRN
  Administered 2017-11-02 (×2): 50 ug via INTRAVENOUS

## 2017-11-02 MED ORDER — IOPAMIDOL (ISOVUE-370) INJECTION 76%
INTRAVENOUS | Status: AC
  Filled 2017-11-02: qty 50

## 2017-11-02 MED ORDER — HEPARIN SODIUM (PORCINE) 1000 UNIT/ML IJ SOLN
INTRAMUSCULAR | Status: DC | PRN
  Administered 2017-11-02: 2000 [IU] via INTRAVENOUS
  Administered 2017-11-02: 7000 [IU] via INTRAVENOUS
  Administered 2017-11-02: 4000 [IU] via INTRAVENOUS

## 2017-11-02 MED ORDER — LABETALOL HCL 5 MG/ML IV SOLN: 10.0000 mg | INTRAVENOUS | Status: AC | PRN

## 2017-11-02 MED ORDER — LIDOCAINE HCL (PF) 1 % IJ SOLN
INTRAMUSCULAR | Status: AC
  Filled 2017-11-02: qty 30

## 2017-11-02 MED ORDER — SODIUM CHLORIDE 0.9 % WEIGHT BASED INFUSION
1.0000 mL/kg/h | INTRAVENOUS | Status: DC
  Administered 2017-11-02 (×2): 1 mL/kg/h via INTRAVENOUS

## 2017-11-02 MED ORDER — IOPAMIDOL (ISOVUE-370) INJECTION 76%
INTRAVENOUS | Status: DC | PRN
  Administered 2017-11-02: 200 mL via INTRA_ARTERIAL

## 2017-11-02 MED ORDER — ASPIRIN 81 MG PO CHEW
81.0000 mg | CHEWABLE_TABLET | Freq: Every day | ORAL | Status: DC
  Administered 2017-11-03: 81 mg via ORAL
  Filled 2017-11-02: qty 1

## 2017-11-02 MED ORDER — TICAGRELOR 90 MG PO TABS
ORAL_TABLET | ORAL | Status: AC
  Filled 2017-11-02: qty 2

## 2017-11-02 MED ORDER — PANTOPRAZOLE SODIUM 40 MG PO TBEC
40.0000 mg | DELAYED_RELEASE_TABLET | Freq: Every day | ORAL | Status: DC
  Administered 2017-11-03: 40 mg via ORAL
  Filled 2017-11-02 (×2): qty 1

## 2017-11-02 MED ORDER — FENTANYL CITRATE (PF) 100 MCG/2ML IJ SOLN
INTRAMUSCULAR | Status: AC
  Filled 2017-11-02: qty 2

## 2017-11-02 MED ORDER — ATORVASTATIN CALCIUM 80 MG PO TABS: 80.0000 mg | ORAL_TABLET | Freq: Every day | ORAL | Status: DC

## 2017-11-02 MED ORDER — IOPAMIDOL (ISOVUE-370) INJECTION 76%
INTRAVENOUS | Status: AC
  Filled 2017-11-02: qty 100

## 2017-11-02 MED ORDER — ACETAMINOPHEN 325 MG PO TABS
650.0000 mg | ORAL_TABLET | ORAL | Status: DC | PRN
  Filled 2017-11-02: qty 2

## 2017-11-02 MED ORDER — TICAGRELOR 90 MG PO TABS
90.0000 mg | ORAL_TABLET | Freq: Two times a day (BID) | ORAL | Status: DC
  Administered 2017-11-03 (×2): 90 mg via ORAL
  Filled 2017-11-02 (×2): qty 1

## 2017-11-02 MED ORDER — SODIUM CHLORIDE 0.9% FLUSH: 3.0000 mL | Freq: Two times a day (BID) | INTRAVENOUS | Status: DC

## 2017-11-02 MED ORDER — SODIUM CHLORIDE 0.9 % WEIGHT BASED INFUSION
3.0000 mL/kg/h | INTRAVENOUS | Status: DC
  Administered 2017-11-02: 06:00:00 3 mL/kg/h via INTRAVENOUS

## 2017-11-02 MED ORDER — VERAPAMIL HCL 2.5 MG/ML IV SOLN
INTRAVENOUS | Status: AC
  Filled 2017-11-02: qty 2

## 2017-11-02 MED ORDER — METOPROLOL TARTRATE 12.5 MG HALF TABLET
12.5000 mg | ORAL_TABLET | Freq: Two times a day (BID) | ORAL | Status: DC
  Administered 2017-11-02 – 2017-11-03 (×4): 12.5 mg via ORAL
  Filled 2017-11-02 (×4): qty 1

## 2017-11-02 MED ORDER — NITROGLYCERIN 0.4 MG SL SUBL
0.4000 mg | SUBLINGUAL_TABLET | SUBLINGUAL | Status: DC | PRN
  Filled 2017-11-02: qty 1

## 2017-11-02 MED ORDER — ATORVASTATIN CALCIUM 80 MG PO TABS
80.0000 mg | ORAL_TABLET | Freq: Every day | ORAL | Status: DC
  Administered 2017-11-02: 80 mg via ORAL
  Filled 2017-11-02 (×2): qty 1

## 2017-11-02 SURGICAL SUPPLY — 20 items
BALLN SAPPHIRE 3.0X15 (BALLOONS) ×2
BALLN SAPPHIRE ~~LOC~~ 3.75X15 (BALLOONS) ×2 IMPLANT
BALLOON SAPPHIRE 3.0X15 (BALLOONS) ×1 IMPLANT
CATH INFINITI 5 FR JL3.5 (CATHETERS) ×2 IMPLANT
CATH INFINITI JR4 5F (CATHETERS) ×2 IMPLANT
CATH VISTA GUIDE 6FR XBLAD3.5 (CATHETERS) ×2 IMPLANT
COVER PRB 48X5XTLSCP FOLD TPE (BAG) ×1 IMPLANT
COVER PROBE 5X48 (BAG) ×1
DEVICE RAD COMP TR BAND LRG (VASCULAR PRODUCTS) ×2 IMPLANT
GLIDESHEATH SLEND A-KIT 6F 22G (SHEATH) ×2 IMPLANT
GUIDEWIRE INQWIRE 1.5J.035X260 (WIRE) ×1 IMPLANT
INQWIRE 1.5J .035X260CM (WIRE) ×2
KIT ENCORE 26 ADVANTAGE (KITS) ×4 IMPLANT
KIT HEART LEFT (KITS) ×2 IMPLANT
PACK CARDIAC CATHETERIZATION (CUSTOM PROCEDURE TRAY) ×2 IMPLANT
STENT SYNERGY DES 3.5X16 (Permanent Stent) ×2 IMPLANT
STENT SYNERGY DES 3.5X8 (Permanent Stent) ×2 IMPLANT
TRANSDUCER W/STOPCOCK (MISCELLANEOUS) ×2 IMPLANT
TUBING CIL FLEX 10 FLL-RA (TUBING) ×2 IMPLANT
WIRE COUGAR XT STRL 190CM (WIRE) ×2 IMPLANT

## 2017-11-02 NOTE — Progress Notes (Signed)
ANTICOAGULATION CONSULT NOTE - Follow Up Consult  Pharmacy Consult for heparin Indication: NSTEMI  Labs: Recent Labs    11/01/17 1305 11/01/17 1355 11/01/17 2305 11/02/17 0052  HGB 15.0  --   --   --   HCT 44.0  --   --   --   PLT 241  --   --   --   APTT  --  31  --   --   LABPROT  --  12.7  --   --   INR  --  0.96  --   --   HEPARINUNFRC  --   --   --  0.40  CREATININE 0.99  --   --   --   TROPONINI  --   --  0.51*  --     Assessment/Plan:  56yo male therapeutic on heparin with initial dosing for NSTEMI. Will continue gtt at current rate and confirm stable with additional level.   Vernard GamblesVeronda Desirea Mizrahi, PharmD, BCPS  11/02/2017,1:26 AM

## 2017-11-02 NOTE — Progress Notes (Signed)
[facility] trop[facility]. Spoke with Clide Clifficky, Charity fundraiserN. Patient troponin 0.51.

## 2017-11-02 NOTE — Progress Notes (Signed)
Pt copay will be $90. Pt has met his deductible. Pt has to pay 30% of drug cost 

## 2017-11-02 NOTE — Care Management Note (Addendum)
Case Management Note  Patient Details  Name: Javier Rowland MRN: [id number] Date of Birth: 06-Jun-1961  Subjective/Objective:  From home with wife, pta , presents with NSTEMI, for cath today.  NCM gave patient the 30 day free savings card and the $5 co pay card.  He will be going to [facility] on [address] and they do have in stock. For dc home today.                  Action/Plan: DC home today.  Expected Discharge Date:  (unknown)               Expected Discharge Plan:  Home/Self Care  In-House Referral:     Discharge planning Services  CM Consult  Post Acute Care Choice:    Choice offered to:     DME Arranged:    DME Agency:     HH Arranged:    HH Agency:     Status of Service:  Completed, signed off  If discussed at [employer] Length of Stay Meetings, dates discussed:    Additional Comments:  Leone Havenaylor, Jayzen Paver Clinton, RN 11/02/2017, 9:44 AM

## 2017-11-02 NOTE — Progress Notes (Signed)
ANTICOAGULATION CONSULT NOTE - Follow Up Consult  Pharmacy Consult for IV Heparin Indication: chest pain/ACS and NSTEMI  No Known Allergies  Patient Measurements: Height: 5\' 6"  (167.6 cm) Weight: 169 lb 1.5 oz (76.7 kg) IBW/kg (Calculated) : 63.8 Heparin Dosing Weight: total body weight  Vital Signs: Temp: 97.9 F (36.6 C) (12/04 0600) Temp Source: Oral (12/04 0600) BP: 133/76 (12/04 0600) Pulse Rate: 65 (12/04 0600)  Labs: Recent Labs    11/01/17 1305 11/01/17 1355 11/01/17 2305 11/02/17 0052 11/02/17 0508  HGB 15.0  --   --   --  15.2  HCT 44.0  --   --   --  44.9  PLT 241  --   --   --  251  APTT  --  31  --   --   --   LABPROT  --  12.7  --   --   --   INR  --  0.96  --   --   --   HEPARINUNFRC  --   --   --  0.40 0.40  CREATININE 0.99  --   --   --  1.01  TROPONINI  --   --  0.51*  --   --     Estimated Creatinine Clearance: 78.8 mL/min (by C-G formula based on SCr of 1.01 mg/dL).   Medications:  Infusions:  . sodium chloride    . sodium chloride    . sodium chloride 1 mL/kg/hr (11/02/17 0644)  . heparin 900 Units/hr (11/02/17 0600)    Assessment: 56 year old male on IV heparin for NSTEMI. Patient has had two heparin levels that are in therapeutic range. The second was done a little early at 4.5 hrs- but no change in level. CBC is stable. No bleeding reported. Patient is scheduled for cardiac cath today at 16:30PM.   Goal of Therapy:  Heparin level 0.3-0.7 units/ml Monitor platelets by anticoagulation protocol: Yes   Plan:  Continue Heparin at 900 units/hr. Daily heparin level and CBC while on therapy.  Follow-up post cardiac cath.  Link SnufferJessica Valisa Karpel, PharmD, BCPS, BCCCP Clinical Pharmacist Clinical phone 11/02/2017 until 3:30PM [phone]- [phone] After hours, please call [phone] 11/02/2017,8:24 AM

## 2017-11-02 NOTE — Progress Notes (Signed)
DAILY PROGRESS NOTE   Patient Name: Javier Rowland Date of Encounter: 11/02/2017  Chief Complaint   One brief episode of CP overnight  Patient Profile    56 yo male with a history of HTN, dyslipidemia and GERD, presents with several weeks of intermittnet SSCP lasting <5 minutes, found to have an elevated troponin.  Subjective   Javier Rowland had a brief episode of chest pain overnight.  He is scheduled for left heart catheterization today.  Vitals been stable.  Troponin is stably elevated at 0.51 and previously was 0.5.  Lipid profile showed total cholesterol 133, HDL 43, LDL-C 71, triglycerides 97.  EKG this morning shows new inferior and anterolateral ischemic EKG changes.  Objective   Vitals:   11/02/17 0300 11/02/17 0400 11/02/17 0530 11/02/17 0600  BP:  112/80  133/76  Pulse: 70 61  65  Resp:  15  13  Temp:  98.4 F (36.9 C)  97.9 F (36.6 C)  TempSrc:  Oral  Oral  SpO2:  96%  96%  Weight:  170 lb 13.7 oz (77.5 kg) 169 lb 1.5 oz (76.7 kg)   Height:        Intake/Output Summary (Last 24 hours) at 11/02/2017 0916 Last data filed at 11/02/2017 0644 Gross per 24 hour  Intake 377.48 ml  Output 400 ml  Net -22.52 ml   Filed Weights   11/01/17 2357 11/02/17 0400 11/02/17 0530  Weight: 170 lb 13.7 oz (77.5 kg) 170 lb 13.7 oz (77.5 kg) 169 lb 1.5 oz (76.7 kg)    Physical Exam   General appearance: alert and no distress Lungs: clear to auscultation bilaterally Heart: regular rate and rhythm, S1, S2 normal, no murmur, click, rub or gallop Extremities: extremities normal, atraumatic, no cyanosis or edema Neurologic: Grossly normal  Inpatient Medications    Scheduled Meds: . aspirin  81 mg Oral Daily  . atorvastatin  80 mg Oral q1800  . metoprolol tartrate  12.5 mg Oral BID  . pantoprazole  40 mg Oral Daily  . sodium chloride flush  3 mL Intravenous Q12H  . sodium chloride flush  3 mL Intravenous Q12H    Continuous Infusions: . sodium chloride    . sodium  chloride    . sodium chloride 1 mL/kg/hr (11/02/17 0644)  . heparin 900 Units/hr (11/02/17 0600)    PRN Meds: sodium chloride, sodium chloride, acetaminophen, nitroGLYCERIN, ondansetron (ZOFRAN) IV, sodium chloride flush, sodium chloride flush   Labs   Results for orders placed or performed during the hospital encounter of 11/01/17 (from the past 48 hour(s))  Basic metabolic panel     Status: None   Collection Time: 11/01/17  1:05 PM  Result Value Ref Range   Sodium 139 135 - 145 mmol/L   Potassium 3.7 3.5 - 5.1 mmol/L   Chloride 104 101 - 111 mmol/L   CO2 26 22 - 32 mmol/L   Glucose, Bld 94 65 - 99 mg/dL   BUN 15 6 - 20 mg/dL   Creatinine, Ser 0.99 0.61 - 1.24 mg/dL   Calcium 8.9 8.9 - 10.3 mg/dL   GFR calc non Af Amer >60 >60 mL/min   GFR calc Af Amer >60 >60 mL/min    Comment: (NOTE) The eGFR has been calculated using the CKD EPI equation. This calculation has not been validated in all clinical situations. eGFR's persistently <60 mL/min signify possible Chronic Kidney Disease.    Anion gap 9 5 - 15  CBC     Status:  None   Collection Time: 11/01/17  1:05 PM  Result Value Ref Range   WBC 8.0 4.0 - 10.5 K/uL   RBC 4.85 4.22 - 5.81 MIL/uL   Hemoglobin 15.0 13.0 - 17.0 g/dL   HCT 44.0 39.0 - 52.0 %   MCV 90.7 78.0 - 100.0 fL   MCH 30.9 26.0 - 34.0 pg   MCHC 34.1 30.0 - 36.0 g/dL   RDW 12.5 11.5 - 15.5 %   Platelets 241 150 - 400 K/uL  I-stat troponin, ED     Status: Abnormal   Collection Time: 11/01/17  1:17 PM  Result Value Ref Range   Troponin i, poc 0.50 (HH) 0.00 - 0.08 ng/mL   Comment NOTIFIED PHYSICIAN    Comment 3            Comment: Due to the release kinetics of cTnI, a negative result within the first hours of the onset of symptoms does not rule out myocardial infarction with certainty. If myocardial infarction is still suspected, repeat the test at appropriate intervals.   Protime-INR     Status: None   Collection Time: 11/01/17  1:55 PM  Result  Value Ref Range   Prothrombin Time 12.7 11.4 - 15.2 seconds   INR 0.96   APTT     Status: None   Collection Time: 11/01/17  1:55 PM  Result Value Ref Range   aPTT 31 24 - 36 seconds  Troponin I     Status: Abnormal   Collection Time: 11/01/17 11:05 PM  Result Value Ref Range   Troponin I 0.51 (HH) <0.03 ng/mL    Comment: CRITICAL RESULT CALLED TO, READ BACK BY AND VERIFIED WITH: Vikki Ports RN [id number] 11/02/17 A NAVARRO   Heparin level (unfractionated)     Status: None   Collection Time: 11/02/17 12:52 AM  Result Value Ref Range   Heparin Unfractionated 0.40 0.30 - 0.70 IU/mL    Comment:        IF HEPARIN RESULTS ARE BELOW EXPECTED VALUES, AND PATIENT DOSAGE HAS BEEN CONFIRMED, SUGGEST FOLLOW UP TESTING OF ANTITHROMBIN III LEVELS.   Basic metabolic panel     Status: Abnormal   Collection Time: 11/02/17  5:08 AM  Result Value Ref Range   Sodium 137 135 - 145 mmol/L   Potassium 4.0 3.5 - 5.1 mmol/L   Chloride 104 101 - 111 mmol/L   CO2 25 22 - 32 mmol/L   Glucose, Bld 88 65 - 99 mg/dL   BUN 13 6 - 20 mg/dL   Creatinine, Ser 1.01 0.61 - 1.24 mg/dL   Calcium 8.7 (L) 8.9 - 10.3 mg/dL   GFR calc non Af Amer >60 >60 mL/min   GFR calc Af Amer >60 >60 mL/min    Comment: (NOTE) The eGFR has been calculated using the CKD EPI equation. This calculation has not been validated in all clinical situations. eGFR's persistently <60 mL/min signify possible Chronic Kidney Disease.    Anion gap 8 5 - 15  CBC     Status: None   Collection Time: 11/02/17  5:08 AM  Result Value Ref Range   WBC 8.7 4.0 - 10.5 K/uL   RBC 4.93 4.22 - 5.81 MIL/uL   Hemoglobin 15.2 13.0 - 17.0 g/dL   HCT 44.9 39.0 - 52.0 %   MCV 91.1 78.0 - 100.0 fL   MCH 30.8 26.0 - 34.0 pg   MCHC 33.9 30.0 - 36.0 g/dL   RDW 12.7 11.5 - 15.5 %  Platelets 251 150 - 400 K/uL  Lipid panel     Status: None   Collection Time: 11/02/17  5:08 AM  Result Value Ref Range   Cholesterol 133 0 - 200 mg/dL   Triglycerides 97 <150  mg/dL   HDL 43 >40 mg/dL   Total CHOL/HDL Ratio 3.1 RATIO   VLDL 19 0 - 40 mg/dL   LDL Cholesterol 71 0 - 99 mg/dL    Comment:        Total Cholesterol/HDL:CHD Risk Coronary Heart Disease Risk Table                     Men   Women  1/2 Average Risk   3.4   3.3  Average Risk       5.0   4.4  2 X Average Risk   9.6   7.1  3 X Average Risk  23.4   11.0        Use the calculated Patient Ratio above and the CHD Risk Table to determine the patient's CHD Risk.        ATP III CLASSIFICATION (LDL):  <100     mg/dL   Optimal  100-129  mg/dL   Near or Above                    Optimal  130-159  mg/dL   Borderline  160-189  mg/dL   High  >190     mg/dL   Very High   Heparin level (unfractionated)     Status: None   Collection Time: 11/02/17  5:08 AM  Result Value Ref Range   Heparin Unfractionated 0.40 0.30 - 0.70 IU/mL    Comment:        IF HEPARIN RESULTS ARE BELOW EXPECTED VALUES, AND PATIENT DOSAGE HAS BEEN CONFIRMED, SUGGEST FOLLOW UP TESTING OF ANTITHROMBIN III LEVELS.     ECG   Sinus rhythm at 78, inferior and anterolateral ST depression and T wave inversions - Personally Reviewed  Telemetry   Sinus rhythm- Personally Reviewed  Radiology    Dg Chest 2 View  Result Date: 11/01/2017 CLINICAL DATA:  Intermittent chest pain for 3 weeks. EXAM: CHEST  2 VIEW COMPARISON:  CT chest 05/13/2016 FINDINGS: The heart size and mediastinal contours are within normal limits. Both lungs are clear. The visualized skeletal structures are unremarkable. IMPRESSION: No active cardiopulmonary disease. Electronically Signed   By: Kathreen Devoid   On: 11/01/2017 15:07    Cardiac Studies   N/A  Assessment   1. Principal Problem: 2.   NSTEMI (non-ST elevated myocardial infarction) ([place]) 3. Active Problems: 4.   HTN (hypertension) 5.   Dyslipidemia 6.   GERD (gastroesophageal reflux disease) 7.   Plan   1. Mr. Sobol had a short episode of chest pain overnight.  He is on IV heparin.   Troponin has stayed fairly flat.  EKG today however shows some dynamic changes with inferior ST depression, T wave inversions and stable anterolateral T wave inversions.  Plan for left heart catheterization today.  I reviewed the risks, benefits and alternatives with him and he is agreeable to proceed.  He is discussed the case with Dr. Tamala Julian who will be performing it and had no further questions.  Time Spent Directly with Patient:  I have spent a total of 15 minutes with the patient reviewing hospital notes, telemetry, EKGs, labs and examining the patient as well as establishing an assessment and plan that was  discussed personally with the patient. > 50% of time was spent in direct patient care.  Length of Stay:  LOS: 1 day   Pixie Casino, MD, [facility], [place] Director of the Advanced Lipid Disorders &  Cardiovascular Risk Reduction Clinic Attending Cardiologist  Direct Dial: [phone]  Fax: [phone]  Website:  [email or web address] 11/02/2017, 9:16 AM

## 2017-11-03 ENCOUNTER — Encounter (HOSPITAL_COMMUNITY): Payer: Self-pay | Admitting: Interventional Cardiology

## 2017-11-03 LAB — BASIC METABOLIC PANEL
ANION GAP: 8 (ref 5–15)
BUN: 14 mg/dL (ref 6–20)
CALCIUM: 8.8 mg/dL — AB (ref 8.9–10.3)
CHLORIDE: 106 mmol/L (ref 101–111)
CO2: 26 mmol/L (ref 22–32)
Creatinine, Ser: 0.87 mg/dL (ref 0.61–1.24)
GFR calc non Af Amer: >60 mL/min (ref >60)
GLUCOSE: 85 mg/dL (ref 65–99)
POTASSIUM: 4 mmol/L (ref 3.5–5.1)
Sodium: 140 mmol/L (ref 135–145)

## 2017-11-03 LAB — CBC
HEMATOCRIT: 45 % (ref 39.0–52.0)
HEMOGLOBIN: 15.2 g/dL (ref 13.0–17.0)
MCH: 30.7 pg (ref 26.0–34.0)
MCHC: 33.8 g/dL (ref 30.0–36.0)
MCV: 90.9 fL (ref 78.0–100.0)
Platelets: 246 10*3/uL (ref 150–400)
RBC: 4.95 MIL/uL (ref 4.22–5.81)
RDW: 12.8 % (ref 11.5–15.5)
WBC: 8.4 10*3/uL (ref 4.0–10.5)

## 2017-11-03 MED ORDER — ATORVASTATIN CALCIUM 80 MG PO TABS: 80.0000 mg | ORAL_TABLET | Freq: Every day | ORAL | 2 refills | Status: DC

## 2017-11-03 MED ORDER — METOPROLOL TARTRATE 25 MG PO TABS: 12.5000 mg | ORAL_TABLET | Freq: Two times a day (BID) | ORAL | 1 refills | Status: DC

## 2017-11-03 MED ORDER — TICAGRELOR 90 MG PO TABS
90.0000 mg | ORAL_TABLET | Freq: Two times a day (BID) | ORAL | 2 refills | Status: DC
Start: 2017-11-03 — End: 2017-11-15

## 2017-11-03 MED ORDER — NITROGLYCERIN 0.4 MG SL SUBL: 0.4000 mg | SUBLINGUAL_TABLET | SUBLINGUAL | 2 refills | Status: DC | PRN

## 2017-11-03 NOTE — Progress Notes (Signed)
DAILY PROGRESS NOTE   Patient Name: Javier Rowland Date of Encounter: 11/03/2017  Chief Complaint   No chest pain  Patient Profile    56 yo male with a history of HTN, dyslipidemia and GERD, presents with several weeks of intermittnet SSCP lasting <5 minutes, found to have an elevated troponin.  Subjective   Feels better today, no chest pain. Found to have a 99% mid LAD stenosis with TIMI II flow distally - s/p PCI with overlapping Synergy DES 16 x 3.5 mm and 8 x 3.5 mm. On aspirin and Brillinta.  Objective   Vitals:   11/02/17 2343 11/03/17 0125 11/03/17 0500 11/03/17 0751  BP: 127/71 125/72 132/77   Pulse: 61 67 68 80  Resp: (!) 21 17 16    Temp: 98.1 F (36.7 C)  98.2 F (36.8 C) 97.9 F (36.6 C)  TempSrc: Oral  Oral Oral  SpO2: 94% 98% 97% 96%  Weight:   170 lb 3.1 oz (77.2 kg)   Height:        Intake/Output Summary (Last 24 hours) at 11/03/2017 0859 Last data filed at 11/03/2017 0700 Gross per 24 hour  Intake 1526.58 ml  Output 2925 ml  Net -1398.42 ml   Filed Weights   11/02/17 0400 11/02/17 0530 11/03/17 0500  Weight: 170 lb 13.7 oz (77.5 kg) 169 lb 1.5 oz (76.7 kg) 170 lb 3.1 oz (77.2 kg)    Physical Exam   General appearance: alert and no distress Lungs: clear to auscultation bilaterally Heart: regular rate and rhythm, S1, S2 normal, no murmur, click, rub or gallop Extremities: extremities normal, atraumatic, no cyanosis or edema Neurologic: Grossly normal  Inpatient Medications    Scheduled Meds: . aspirin  81 mg Oral Daily  . atorvastatin  80 mg Oral q1800  . heparin  5,000 Units Subcutaneous Q8H  . metoprolol tartrate  12.5 mg Oral BID  . pantoprazole  40 mg Oral Daily  . sodium chloride flush  3 mL Intravenous Q12H  . ticagrelor  90 mg Oral BID    Continuous Infusions: . sodium chloride      PRN Meds: sodium chloride, acetaminophen, acetaminophen, nitroGLYCERIN, ondansetron (ZOFRAN) IV, ondansetron (ZOFRAN) IV, oxyCODONE, sodium  chloride flush   Labs   Results for orders placed or performed during the hospital encounter of 11/01/17 (from the past 48 hour(s))  Basic metabolic panel     Status: None   Collection Time: 11/01/17  1:05 PM  Result Value Ref Range   Sodium 139 135 - 145 mmol/L   Potassium 3.7 3.5 - 5.1 mmol/L   Chloride 104 101 - 111 mmol/L   CO2 26 22 - 32 mmol/L   Glucose, Bld 94 65 - 99 mg/dL   BUN 15 6 - 20 mg/dL   Creatinine, Ser 0.99 0.61 - 1.24 mg/dL   Calcium 8.9 8.9 - 10.3 mg/dL   GFR calc non Af Amer >60 >60 mL/min   GFR calc Af Amer >60 >60 mL/min    Comment: (NOTE) The eGFR has been calculated using the CKD EPI equation. This calculation has not been validated in all clinical situations. eGFR's persistently <60 mL/min signify possible Chronic Kidney Disease.    Anion gap 9 5 - 15  CBC     Status: None   Collection Time: 11/01/17  1:05 PM  Result Value Ref Range   WBC 8.0 4.0 - 10.5 K/uL   RBC 4.85 4.22 - 5.81 MIL/uL   Hemoglobin 15.0 13.0 - 17.0 g/dL  HCT 44.0 39.0 - 52.0 %   MCV 90.7 78.0 - 100.0 fL   MCH 30.9 26.0 - 34.0 pg   MCHC 34.1 30.0 - 36.0 g/dL   RDW 12.5 11.5 - 15.5 %   Platelets 241 150 - 400 K/uL  I-stat troponin, ED     Status: Abnormal   Collection Time: 11/01/17  1:17 PM  Result Value Ref Range   Troponin i, poc 0.50 (HH) 0.00 - 0.08 ng/mL   Comment NOTIFIED PHYSICIAN    Comment 3            Comment: Due to the release kinetics of cTnI, a negative result within the first hours of the onset of symptoms does not rule out myocardial infarction with certainty. If myocardial infarction is still suspected, repeat the test at appropriate intervals.   Protime-INR     Status: None   Collection Time: 11/01/17  1:55 PM  Result Value Ref Range   Prothrombin Time 12.7 11.4 - 15.2 seconds   INR 0.96   APTT     Status: None   Collection Time: 11/01/17  1:55 PM  Result Value Ref Range   aPTT 31 24 - 36 seconds  Troponin I     Status: Abnormal   Collection  Time: 11/01/17 11:05 PM  Result Value Ref Range   Troponin I 0.51 (HH) <0.03 ng/mL    Comment: CRITICAL RESULT CALLED TO, READ BACK BY AND VERIFIED WITH: Vikki Ports RN [id number] 11/02/17 A NAVARRO   HIV antibody (Routine Testing)     Status: None   Collection Time: 11/02/17 12:52 AM  Result Value Ref Range   HIV Screen 4th Generation wRfx Non Reactive Non Reactive    Comment: (NOTE) Performed At: [facility] [address] [place], [place] [id number] Rush Farmer MD YI:[id number]   Heparin level (unfractionated)     Status: None   Collection Time: 11/02/17 12:52 AM  Result Value Ref Range   Heparin Unfractionated 0.40 0.30 - 0.70 IU/mL    Comment:        IF HEPARIN RESULTS ARE BELOW EXPECTED VALUES, AND PATIENT DOSAGE HAS BEEN CONFIRMED, SUGGEST FOLLOW UP TESTING OF ANTITHROMBIN III LEVELS.   Basic metabolic panel     Status: Abnormal   Collection Time: 11/02/17  5:08 AM  Result Value Ref Range   Sodium 137 135 - 145 mmol/L   Potassium 4.0 3.5 - 5.1 mmol/L   Chloride 104 101 - 111 mmol/L   CO2 25 22 - 32 mmol/L   Glucose, Bld 88 65 - 99 mg/dL   BUN 13 6 - 20 mg/dL   Creatinine, Ser 1.01 0.61 - 1.24 mg/dL   Calcium 8.7 (L) 8.9 - 10.3 mg/dL   GFR calc non Af Amer >60 >60 mL/min   GFR calc Af Amer >60 >60 mL/min    Comment: (NOTE) The eGFR has been calculated using the CKD EPI equation. This calculation has not been validated in all clinical situations. eGFR's persistently <60 mL/min signify possible Chronic Kidney Disease.    Anion gap 8 5 - 15  CBC     Status: None   Collection Time: 11/02/17  5:08 AM  Result Value Ref Range   WBC 8.7 4.0 - 10.5 K/uL   RBC 4.93 4.22 - 5.81 MIL/uL   Hemoglobin 15.2 13.0 - 17.0 g/dL   HCT 44.9 39.0 - 52.0 %   MCV 91.1 78.0 - 100.0 fL   MCH 30.8 26.0 - 34.0 pg  MCHC 33.9 30.0 - 36.0 g/dL   RDW 12.7 11.5 - 15.5 %   Platelets 251 150 - 400 K/uL  Lipid panel     Status: None   Collection Time: 11/02/17  5:08 AM  Result Value  Ref Range   Cholesterol 133 0 - 200 mg/dL   Triglycerides 97 <150 mg/dL   HDL 43 >40 mg/dL   Total CHOL/HDL Ratio 3.1 RATIO   VLDL 19 0 - 40 mg/dL   LDL Cholesterol 71 0 - 99 mg/dL    Comment:        Total Cholesterol/HDL:CHD Risk Coronary Heart Disease Risk Table                     Men   Women  1/2 Average Risk   3.4   3.3  Average Risk       5.0   4.4  2 X Average Risk   9.6   7.1  3 X Average Risk  23.4   11.0        Use the calculated Patient Ratio above and the CHD Risk Table to determine the patient's CHD Risk.        ATP III CLASSIFICATION (LDL):  <100     mg/dL   Optimal  100-129  mg/dL   Near or Above                    Optimal  130-159  mg/dL   Borderline  160-189  mg/dL   High  >190     mg/dL   Very High   Heparin level (unfractionated)     Status: None   Collection Time: 11/02/17  5:08 AM  Result Value Ref Range   Heparin Unfractionated 0.40 0.30 - 0.70 IU/mL    Comment:        IF HEPARIN RESULTS ARE BELOW EXPECTED VALUES, AND PATIENT DOSAGE HAS BEEN CONFIRMED, SUGGEST FOLLOW UP TESTING OF ANTITHROMBIN III LEVELS.   POCT Activated clotting time     Status: None   Collection Time: 11/02/17  2:22 PM  Result Value Ref Range   Activated Clotting Time 543 seconds  POCT Activated clotting time     Status: None   Collection Time: 11/02/17  2:53 PM  Result Value Ref Range   Activated Clotting Time 252 seconds  CBC     Status: None   Collection Time: 11/03/17  3:18 AM  Result Value Ref Range   WBC 8.4 4.0 - 10.5 K/uL   RBC 4.95 4.22 - 5.81 MIL/uL   Hemoglobin 15.2 13.0 - 17.0 g/dL   HCT 45.0 39.0 - 52.0 %   MCV 90.9 78.0 - 100.0 fL   MCH 30.7 26.0 - 34.0 pg   MCHC 33.8 30.0 - 36.0 g/dL   RDW 12.8 11.5 - 15.5 %   Platelets 246 150 - 400 K/uL  Basic metabolic panel     Status: Abnormal   Collection Time: 11/03/17  3:18 AM  Result Value Ref Range   Sodium 140 135 - 145 mmol/L   Potassium 4.0 3.5 - 5.1 mmol/L   Chloride 106 101 - 111 mmol/L   CO2 26 22  - 32 mmol/L   Glucose, Bld 85 65 - 99 mg/dL   BUN 14 6 - 20 mg/dL   Creatinine, Ser 0.87 0.61 - 1.24 mg/dL   Calcium 8.8 (L) 8.9 - 10.3 mg/dL   GFR calc non Af Amer >60 >60 mL/min   GFR  calc Af Amer >60 >60 mL/min    Comment: (NOTE) The eGFR has been calculated using the CKD EPI equation. This calculation has not been validated in all clinical situations. eGFR's persistently <60 mL/min signify possible Chronic Kidney Disease.    Anion gap 8 5 - 15    ECG   N/A  Telemetry   Sinus rhythm- Personally Reviewed  Radiology    Dg Chest 2 View  Result Date: 11/01/2017 CLINICAL DATA:  Intermittent chest pain for 3 weeks. EXAM: CHEST  2 VIEW COMPARISON:  CT chest 05/13/2016 FINDINGS: The heart size and mediastinal contours are within normal limits. Both lungs are clear. The visualized skeletal structures are unremarkable. IMPRESSION: No active cardiopulmonary disease. Electronically Signed   By: Kathreen Devoid   On: 11/01/2017 15:07    Cardiac Studies   N/A  Assessment   Principal Problem:   NSTEMI (non-ST elevated myocardial infarction) ([place]) Active Problems:   HTN (hypertension)   Dyslipidemia   GERD (gastroesophageal reflux disease)   Plan   1. Mr. Donado had NSTEMI and found to have significant LAD stenosis which was successfully treated with overlapping DESs. No issues overnight. Creatinine normal today. LDL 71 - switched from simvastatin 20 mg to atorvastatin 80 mg QHS. BP normal today. On ASA, Brillinta, atorvastatin, and lopressor. Will need Rx's for atorvastatin, Brillinta and lopressor for 90-day supply. [place] for d/c home today. Follow-up with APP in 7-10 days and then me at the [facility] office.  Time Spent Directly with Patient:  I have spent a total of 15 minutes with the patient reviewing hospital notes, telemetry, EKGs, labs and examining the patient as well as establishing an assessment and plan that was discussed personally with the patient. > 50% of time was  spent in direct patient care.  Length of Stay:  LOS: 2 days   Pixie Casino, MD, [facility], [place] Director of the Advanced Lipid Disorders &  Cardiovascular Risk Reduction Clinic Attending Cardiologist  Direct Dial: [phone]  Fax: [phone]  Website:  [email or web address] 11/03/2017, 8:59 AM

## 2017-11-03 NOTE — Discharge Summary (Signed)
Discharge Summary    Patient ID: Javier Rowland,  MRN: [id number], DOB/AGE: 57-Jul-1962 57 y.o.  Admit date: 11/01/2017 Discharge date: 11/03/2017  Primary Care Provider: Farris Has Primary Cardiologist: [facility]   Discharge Diagnoses    Principal Problem:   NSTEMI (non-ST elevated myocardial infarction) [facility]) Active Problems:   HTN (hypertension)   Dyslipidemia   GERD (gastroesophageal reflux disease)   Allergies No Known Allergies  Diagnostic Studies/Procedures    Cath: 11/02/17  Conclusion    Severe eccentric 99% mid LAD stenosis beyond which there is TIMI grade II flow.  The most severe lesion lies within a segment of 40% narrowing in the mid vessel.  The LAD is otherwise widely patent  Widely patent left main.  50% segmental stenosis in the second obtuse marginal.  Normal widely patent nondominant right coronary.  Successful PCI of the mid LAD using overlapping synergy 16 x 3.5 and 8 x 3.5 drug-eluting stents postdilated to 3.75 mm in diameter.  99% stenosis reduced to 0% with TIMI grade III flow.  RECOMMENDATIONS:   Aspirin and Brilinta for at least 12 months.  Aggressive risk factor modification: LDL less than 70, screening for diabetes, blood pressure 130/85 mmHg or less, and others as clinically indicated.  Eligible for discharge in a.m. if clinically stable.  _____________   History of Present Illness     56 y.o. male who presented for the evaluation of chest pain at the request of Dr. Freida Busman. This is a 56 yo male with a history of HTN, dyslipidemia and GERD, who presented with several weeks of intermittnet SSCP lasting <5 minutes. He works in Production designer, theatre/television/film and reported that approximately 3 weeks ago he had an episode of intense substernal chest pain which radiated to both arms and back to his back.  He said it lasted for about 5-10 minutes and then improved.  He distinctly thought it may be a heart attack however he said he was "ready to die any time".   He seemed more concerned about the cost of healthcare, but made it a point that his wife brought him into the emergency department as he continued to have intermittent episodes of chest pain.  On admission, blood work showed an elevated troponin of 0.5.  EKG reviewed and showed borderline anterolateral ST elevation with T wave inversions suggestive of subacute anterior MI. Given his symptoms he was transferred to [facility] for cardiac cath.   Hospital Course     Underwent cardiac cath noted above with PCI/DES to mLAD. Plan for DAPT with ASA/Brilinta. Troponin peaked at 0.51. LDL 71, started on high dose statin. Lv gram reported SF of 45-50%. No complications noted post cath. Labs stable the following morning. Was started on low dose metoprolol and tolerated well.   Javier Rowland was seen by Dr. Rennis Golden and determined stable for discharge home. Follow up in the office has been arranged. Medications are listed below.   _____________  Discharge Vitals Blood pressure 132/77, pulse 80, temperature 97.9 F (36.6 C), temperature source Oral, resp. rate 16, height 5\' 6"  (1.676 m), weight 170 lb 3.1 oz (77.2 kg), SpO2 96 %.  Filed Weights   11/02/17 0400 11/02/17 0530 11/03/17 0500  Weight: 170 lb 13.7 oz (77.5 kg) 169 lb 1.5 oz (76.7 kg) 170 lb 3.1 oz (77.2 kg)    Labs & Radiologic Studies    CBC Recent Labs    11/02/17 0508 11/03/17 0318  WBC 8.7 8.4  HGB 15.2 15.2  HCT 44.9 45.0  MCV 91.1 90.9  PLT 251 246   Basic Metabolic Panel Recent Labs    16/08/9611/04/18 0508 11/03/17 0318  NA 137 140  K 4.0 4.0  CL 104 106  CO2 25 26  GLUCOSE 88 85  BUN 13 14  CREATININE 1.01 0.87  CALCIUM 8.7* 8.8*   Liver Function Tests No results for input(s): AST, ALT, ALKPHOS, BILITOT, PROT, ALBUMIN in the last 72 hours. No results for input(s): LIPASE, AMYLASE in the last 72 hours. Cardiac Enzymes Recent Labs    11/01/17 2305  TROPONINI 0.51*   BNP Invalid input(s): POCBNP D-Dimer No results for  input(s): DDIMER in the last 72 hours. Hemoglobin A1C No results for input(s): HGBA1C in the last 72 hours. Fasting Lipid Panel Recent Labs    11/02/17 0508  CHOL 133  HDL 43  LDLCALC 71  TRIG 97  CHOLHDL 3.1   Thyroid Function Tests No results for input(s): TSH, T4TOTAL, T3FREE, THYROIDAB in the last 72 hours.  Invalid input(s): FREET3 _____________  Dg Chest 2 View  Result Date: 11/01/2017 CLINICAL DATA:  Intermittent chest pain for 3 weeks. EXAM: CHEST  2 VIEW COMPARISON:  CT chest 05/13/2016 FINDINGS: The heart size and mediastinal contours are within normal limits. Both lungs are clear. The visualized skeletal structures are unremarkable. IMPRESSION: No active cardiopulmonary disease. Electronically Signed   By: Elige KoHetal  Patel   On: 11/01/2017 15:07   Disposition   Pt is being discharged home today in good condition.  Follow-up Plans & Appointments    Follow-up Information    Ellsworth LennoxStrader, Brittany M, PA-C Follow up on 11/15/2017.   Specialties:  Physician Assistant, Cardiology Why:  at 11:30am for your follow up appt.  Contact information: [address] [place] [address] [phone]          Discharge Instructions    Amb Referral to Cardiac Rehabilitation   Complete by:  As directed    Diagnosis:   NSTEMI Coronary Stents     Call MD for:  redness, tenderness, or signs of infection (pain, swelling, redness, odor or green/yellow discharge around incision site)   Complete by:  As directed    Diet - low sodium heart healthy   Complete by:  As directed    Discharge instructions   Complete by:  As directed    Radial Site Care Refer to this sheet in the next few weeks. These instructions provide you with information on caring for yourself after your procedure. Your caregiver may also give you more specific instructions. Your treatment has been planned according to current medical practices, but problems sometimes occur. Call your caregiver if you have  any problems or questions after your procedure. HOME CARE INSTRUCTIONS You may shower the day after the procedure.Remove the bandage (dressing) and gently wash the site with plain soap and water.Gently pat the site dry.  Do not apply powder or lotion to the site.  Do not submerge the affected site in water for 3 to 5 days.  Inspect the site at least twice daily.  Do not flex or bend the affected arm for 24 hours.  No lifting over 5 pounds (2.3 kg) for 5 days after your procedure.  Do not drive home if you are discharged the same day of the procedure. Have someone else drive you.  You may drive 24 hours after the procedure unless otherwise instructed by your caregiver.  What to expect: Any bruising will usually fade within 1 to 2 weeks.  Blood  that collects in the tissue (hematoma) may be painful to the touch. It should usually decrease in size and tenderness within 1 to 2 weeks.  SEEK IMMEDIATE MEDICAL CARE IF: You have unusual pain at the radial site.  You have redness, warmth, swelling, or pain at the radial site.  You have drainage (other than a small amount of blood on the dressing).  You have chills.  You have a fever or persistent symptoms for more than 72 hours.  You have a fever and your symptoms suddenly get worse.  Your arm becomes pale, cool, tingly, or numb.  You have heavy bleeding from the site. Hold pressure on the site.   PLEASE DO NOT MISS ANY DOSES OF YOUR BRILINTA!!!!! Also keep a log of you blood pressures and bring back to your follow up appt. Please call the office with any questions.   Patients taking blood thinners should generally stay away from medicines like ibuprofen, Advil, Motrin, naproxen, and Aleve due to risk of stomach bleeding. You may take Tylenol as directed or talk to your primary doctor about alternatives.   Increase activity slowly   Complete by:  As directed      Discharge Medications     Medication List    STOP taking these medications     famotidine 20 MG tablet Commonly known as:  PEPCID   ibuprofen 200 MG tablet Commonly known as:  ADVIL,MOTRIN   naproxen sodium 220 MG tablet Commonly known as:  ALEVE   simvastatin 20 MG tablet Commonly known as:  ZOCOR     TAKE these medications   aspirin 81 MG tablet Take 81 mg by mouth daily.   atorvastatin 80 MG tablet Commonly known as:  LIPITOR Take 1 tablet (80 mg total) by mouth daily at 6 PM.   metoprolol tartrate 25 MG tablet Commonly known as:  LOPRESSOR Take 0.5 tablets (12.5 mg total) by mouth 2 (two) times daily.   nitroGLYCERIN 0.4 MG SL tablet Commonly known as:  NITROSTAT Place 1 tablet (0.4 mg total) under the tongue every 5 (five) minutes x 3 doses as needed for chest pain.   pantoprazole 40 MG tablet Commonly known as:  PROTONIX Take 1 tablet (40 mg total) by mouth daily. What changed:    when to take this  reasons to take this   ticagrelor 90 MG Tabs tablet Commonly known as:  BRILINTA Take 1 tablet (90 mg total) by mouth 2 (two) times daily.        Aspirin prescribed at discharge?  Yes High Intensity Statin Prescribed? (Lipitor 40-80mg  or Crestor 20-40mg ): Yes Beta Blocker Prescribed? Yes For EF <40%, was ACEI/ARB Prescribed? No: EF ok ADP Receptor Inhibitor Prescribed? (i.e. Plavix etc.-Includes Medically Managed Patients): Yes For EF <40%, Aldosterone Inhibitor Prescribed? No: EF ok Was EF assessed during THIS hospitalization? Yes Was Cardiac Rehab II ordered? (Included Medically managed Patients): Yes   Outstanding Labs/Studies   FLP/LFTs in 6 weeks   Duration of Discharge Encounter   Greater than 30 minutes including physician time.  Signed, Laverda PageLindsay Roberts NP-C 11/03/2017, 1:08 PM

## 2017-11-03 NOTE — Progress Notes (Signed)
CARDIAC REHAB PHASE I   PRE:  Rate/Rhythm: 79 SR  BP:  Supine:   Sitting: 151/79  Standing:    SaO2:   MODE:  Ambulation: 800 ft   POST:  Rate/Rhythm: 106 ST  BP:  Supine:   Sitting: 159/99  Standing:    SaO2:  0805-0900 Pt walked 800 ft with steady gait and no CP. Tolerated well. Wants to go home. MI education completed with pt and wife who voiced understanding. Stressed importance of brilinta with stent. Needs to see case manager for brilinta card. Reviewed MI restrictions, NTG use, ex ed and heart healthy diet. Discussed CRP 2 and will refer to [place]. Pt does not think he will be able to attend due to work schedule. He would like wife to be contacted to see if maybe he could attend once a week. Will refer. Stressed with pt the importance of letting cardiologist know if any recurring CP relieved with one NTG and calling EMS if not relieved but not to wait at home if he thinks he needs to come to hospital.   Luetta Nuttingharlene Raymone Pembroke, RN BSN  11/03/2017 8:58 AM

## 2017-11-05 ENCOUNTER — Telehealth (HOSPITAL_COMMUNITY): Payer: Self-pay

## 2017-11-05 NOTE — Telephone Encounter (Signed)
Patient's insurance is active and benefits verified through [place] - No co-pay, deductible amount of $750.00/$425.40 has been met, out of pocket amount of $2,250/$425.40 has been met, 20% co-insurance, and no pre-authorization is required. Passport/reference [id number]  Patient will be contacted and scheduled after their follow up appt with the Cardiologist office upon review by the RN Navigator.

## 2017-11-14 NOTE — Progress Notes (Signed)
Cardiology Office Note    Date:  11/15/2017   ID:  Javier Rowland, DOB 03/22/1961, MRN [id number]  PCP:  Javier Has, MD  Cardiologist:  Dr. Rennis Golden  Chief Complaint  Patient presents with  . Hospitalization Follow-up    s/p NSTEMI    History of Present Illness:    Javier Rowland is a 56 y.o. male with past medical history of HTN and HLD who presents to the office today for hospital follow-up.   He was recently admitted to [facility] from 12/3 - 11/03/2017 for evaluation of chest pain. He reported having episodes of intermittent substernal chest discomfort for the past several weeks lasting for 5-10 minutes at a time. His initial troponin value was found to be elevated at 0.50 and he was admitted for further evaluation of his NSTEMI. He underwent a cardiac catheterization on 11/02/2017 which showed 99% stenosis along the mid LAD. This was treated with placement of two overlapping Synergy DES. He was started on ASA and Brilinta and will be on these for at least 12 months.Lopressor and statin therapy were also added to his medication regimen.    In talking with the patient today, he reports overall doing well since his recent hospitalization. He denies any recurrent chest pain, dyspnea on exertion, orthopnea, PND, lower extremity edema, or palpitations. He Rowland been walking for up to 30 minutes as a time and denies any anginal symptoms with this.   He reports good compliance with his medications and denies missing any recent doses of ASA or Brilinta. He Rowland been following BP closely and it Rowland remained well-controlled by review of his BP diary. BP is well-controlled at 124/60 during today's visit.    Past Medical History:  Diagnosis Date  . CAD (coronary artery disease)    a. 10/2017: s/p NSTEMI with 2 overlapping DES to mid-LAD.   Marland Kitchen HTN (hypertension) 11/01/2017  . Hypercholesterolemia   . Hyperlipidemia     Past Surgical History:  Procedure Laterality Date  . CORONARY STENT  INTERVENTION N/A 11/02/2017   Procedure: CORONARY STENT INTERVENTION;  Surgeon: Lyn Records, MD;  Location[facility] INVASIVE CV LAB;  Service: Cardiovascular;  Laterality: N/A;  . LEFT HEART CATH AND CORONARY ANGIOGRAPHY N/A 11/02/2017   Procedure: LEFT HEART CATH AND CORONARY ANGIOGRAPHY;  Surgeon: Lyn Records, MD;  Location: [facility] INVASIVE CV LAB;  Service: Cardiovascular;  Laterality: N/A;    Current Medications: Outpatient Medications Prior to Visit  Medication Sig Dispense Refill  . aspirin 81 MG tablet Take 81 mg by mouth daily.    . nitroGLYCERIN (NITROSTAT) 0.4 MG SL tablet Place 1 tablet (0.4 mg total) under the tongue every 5 (five) minutes x 3 doses as needed for chest pain. 25 tablet 2  . atorvastatin (LIPITOR) 80 MG tablet Take 1 tablet (80 mg total) by mouth daily at 6 PM. 90 tablet 2  . metoprolol tartrate (LOPRESSOR) 25 MG tablet Take 0.5 tablets (12.5 mg total) by mouth 2 (two) times daily. 90 tablet 1  . pantoprazole (PROTONIX) 40 MG tablet Take 1 tablet (40 mg total) by mouth daily. (Patient taking differently: Take 40 mg by mouth daily as needed (acid reflux.). ) 30 tablet 5  . ticagrelor (BRILINTA) 90 MG TABS tablet Take 1 tablet (90 mg total) by mouth 2 (two) times daily. 180 tablet 2   No facility-administered medications prior to visit.      Allergies:   Patient Rowland no known allergies.   Social History  Socioeconomic History  . Marital status: Married    Spouse name: None  . Number of children: None  . Years of education: None  . Highest education level: None  Social Needs  . Financial resource strain: None  . Food insecurity - worry: None  . Food insecurity - inability: None  . Transportation needs - medical: None  . Transportation needs - non-medical: None  Occupational History  . None  Tobacco Use  . Smoking status: Never Smoker  . Smokeless tobacco: Never Used  Substance and Sexual Activity  . Alcohol use: No    Alcohol/week: 0.0 oz  . Drug use: No    . Sexual activity: None  Other Topics Concern  . None  Social History Narrative  . None     Family History:  The patient's family history includes Allergies in his daughter; Colon cancer in his father; Heart disease in his maternal grandfather.   Review of Systems:   Please see the history of present illness.     General:  No chills, fever, night sweats or weight changes.  Cardiovascular:  No dyspnea on exertion, edema, orthopnea, palpitations, paroxysmal nocturnal dyspnea. Positive for chest pain (now resolved).  Dermatological: No rash, lesions/masses Respiratory: No cough, dyspnea Urologic: No hematuria, dysuria Abdominal:   No nausea, vomiting, diarrhea, bright red blood per rectum, melena, or hematemesis Neurologic:  No visual changes, wkns, changes in mental status. All other systems reviewed and are otherwise negative except as noted above.   Physical Exam:    VS:  BP 124/60   Pulse 69   Ht 5\' 6"  (1.676 m)   Wt 174 lb 12.8 oz (79.3 kg)   BMI 28.21 kg/m    General: Well developed, well nourished Caucasian male appearing in no acute distress. Head: Normocephalic, atraumatic, sclera non-icteric, no xanthomas, nares are without discharge.  Neck: No carotid bruits. JVD not elevated.  Lungs: Respirations regular and unlabored, without wheezes or rales.  Heart: Regular rate and rhythm. No S3 or S4.  No murmur, no rubs, or gallops appreciated. Abdomen: Soft, non-tender, non-distended with normoactive bowel sounds. No hepatomegaly. No rebound/guarding. No obvious abdominal masses. Msk:  Strength and tone appear normal for age. No joint deformities or effusions. Extremities: No clubbing or cyanosis. No lower extremity edema.  Distal pedal pulses are 2+ bilaterally. Radial site stable without evidence of a hematoma.  Neuro: Alert and oriented X 3. Moves all extremities spontaneously. No focal deficits noted. Psych:  Responds to questions appropriately with a normal affect. Skin:  No rashes or lesions noted  Wt Readings from Last 3 Encounters:  11/15/17 174 lb 12.8 oz (79.3 kg)  11/03/17 170 lb 3.1 oz (77.2 kg)  05/05/16 178 lb 12.8 oz (81.1 kg)     Studies/Labs Reviewed:   EKG:  EKG is ordered today.  The ekg ordered today demonstrates NSR, HR 69, with lateral TWI (similar to prior tracings).   Recent Labs: 11/03/2017: BUN 14; Creatinine, Ser 0.87; Hemoglobin 15.2; Platelets 246; Potassium 4.0; Sodium 140   Lipid Panel    Component Value Date/Time   CHOL 133 11/02/2017 0508   TRIG 97 11/02/2017 0508   HDL 43 11/02/2017 0508   CHOLHDL 3.1 11/02/2017 0508   VLDL 19 11/02/2017 0508   LDLCALC 71 11/02/2017 0508    Additional studies/ records that were reviewed today include:   Cardiac Catheterization: 11/02/2017  Severe eccentric 99% mid LAD stenosis beyond which there is TIMI grade II flow.  The most severe lesion  lies within a segment of 40% narrowing in the mid vessel.  The LAD is otherwise widely patent  Widely patent left main.  50% segmental stenosis in the second obtuse marginal.  Normal widely patent nondominant right coronary.  Successful PCI of the mid LAD using overlapping synergy 16 x 3.5 and 8 x 3.5 drug-eluting stents postdilated to 3.75 mm in diameter.  99% stenosis reduced to 0% with TIMI grade III flow.  RECOMMENDATIONS:   Aspirin and Brilinta for at least 12 months.  Aggressive risk factor modification: LDL less than 70, screening for diabetes, blood pressure 130/85 mmHg or less, and others as clinically indicated.  Eligible for discharge in a.m. if clinically stable.  Assessment:    1. Non-ST elevation myocardial infarction (NSTEMI), subsequent episode of care (HCC)   2. Coronary artery disease involving native coronary artery of native heart without angina pectoris   3. Essential hypertension   4. Hyperlipidemia LDL goal <70      Plan:   In order of problems listed above:  1. Subsequent Episode of Care for NSTEMI/  CAD - recently admitted for evaluation of chest pain and found to have an NSTEMI with cath showing 99% stenosis along the mid LAD. This was treated with placement of two overlapping Synergy DES. Rowland residual 50% 2nd OM stenosis. - he denies any recurrent anginal symptoms. Plans [facility]. - will continue on ASA and Brilinta for at least 12 months. Continue BB and statin therapy.   2. HTN - BP is well-controlled at 124/60 during today's visit.  - continue Lopressor 12.5mg  BID.   3. HLD - Lipid Panel during recent admission showed total cholesterol of 133, HDL 43, and LDL 71. Switched from low-intensity statin therapy to Lipitor 80mg  daily. Plan for repeat FLP and LFT's in 6-8 weeks. Goal LDL is < 70 with known CAD.    Medication Adjustments/Labs and Tests Ordered: Current medicines are reviewed at length with the patient today.  Concerns regarding medicines are outlined above.  Medication changes, Labs and Tests ordered today are listed in the Patient Instructions below. Patient Instructions  Medication Instructions: Your physician recommends that you continue on your current medications as directed. Please refer to the Current Medication list given to you today.  If you need a refill on your cardiac medications before your next appointment, please call your pharmacy.   Follow-Up: Your physician wants you to follow-up in: 3 months with Dr. Rennis GoldenHilty. You will receive a reminder letter in the mail two months in advance. If you don't receive a letter, please call our office at [phone] to schedule this follow-up appointment.   Thank you for choosing [facility]!!      Signed, Ellsworth LennoxBrittany M Jakorey Mcconathy, PA-C  11/15/2017 7:30 PM    [facility] [address] [place]  [address] Phone: [phone]; Fax: [phone]  [address], [address] [place], [place] [address] Phone: [phone]

## 2017-11-15 ENCOUNTER — Ambulatory Visit (INDEPENDENT_AMBULATORY_CARE_PROVIDER_SITE_OTHER): Payer: Managed Care, Other (non HMO) | Admitting: Student

## 2017-11-15 ENCOUNTER — Encounter: Payer: Self-pay | Admitting: Student

## 2017-11-15 VITALS — BP 124/60 | HR 69 | Ht 66.0 in | Wt 174.8 lb

## 2017-11-15 DIAGNOSIS — I10 Essential (primary) hypertension: Secondary | ICD-10-CM

## 2017-11-15 DIAGNOSIS — I2510 Atherosclerotic heart disease of native coronary artery without angina pectoris: Secondary | ICD-10-CM | POA: Diagnosis not present

## 2017-11-15 DIAGNOSIS — I214 Non-ST elevation (NSTEMI) myocardial infarction: Secondary | ICD-10-CM | POA: Diagnosis not present

## 2017-11-15 DIAGNOSIS — E785 Hyperlipidemia, unspecified: Secondary | ICD-10-CM

## 2017-11-15 MED ORDER — METOPROLOL TARTRATE 25 MG PO TABS
12.5000 mg | ORAL_TABLET | Freq: Two times a day (BID) | ORAL | 3 refills | Status: DC
Start: 2017-11-15 — End: 2018-04-26

## 2017-11-15 MED ORDER — ATORVASTATIN CALCIUM 80 MG PO TABS: 80.0000 mg | ORAL_TABLET | Freq: Every day | ORAL | 3 refills | Status: DC

## 2017-11-15 MED ORDER — PANTOPRAZOLE SODIUM 40 MG PO TBEC: 40.0000 mg | DELAYED_RELEASE_TABLET | Freq: Every day | ORAL | 3 refills | Status: DC

## 2017-11-15 MED ORDER — TICAGRELOR 90 MG PO TABS: 90.0000 mg | ORAL_TABLET | Freq: Two times a day (BID) | ORAL | 3 refills | Status: DC

## 2017-11-15 NOTE — Patient Instructions (Signed)
Medication Instructions: Your physician recommends that you continue on your current medications as directed. Please refer to the Current Medication list given to you today.  If you need a refill on your cardiac medications before your next appointment, please call your pharmacy.    Follow-Up: Your physician wants you to follow-up in: 3 months with Dr. Hilty. You will receive a reminder letter in the mail two months in advance. If you don't receive a letter, please call our office at [phone] to schedule this follow-up appointment.   Thank you for choosing [facility]!!      

## 2017-11-17 ENCOUNTER — Telehealth (HOSPITAL_COMMUNITY): Payer: Self-pay

## 2017-11-17 NOTE — Telephone Encounter (Signed)
Called and spoke with patient in regards to cardiac rehab - Patient is not interested in the program. 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 Closed referral.

## 2018-02-11 ENCOUNTER — Encounter: Payer: Self-pay | Admitting: Internal Medicine

## 2018-02-11 ENCOUNTER — Ambulatory Visit (INDEPENDENT_AMBULATORY_CARE_PROVIDER_SITE_OTHER): Payer: Managed Care, Other (non HMO) | Admitting: Internal Medicine

## 2018-02-11 VITALS — BP 128/72 | HR 60 | Ht 66.0 in | Wt 174.0 lb

## 2018-02-11 DIAGNOSIS — I2510 Atherosclerotic heart disease of native coronary artery without angina pectoris: Secondary | ICD-10-CM | POA: Diagnosis not present

## 2018-02-11 DIAGNOSIS — E785 Hyperlipidemia, unspecified: Secondary | ICD-10-CM | POA: Diagnosis not present

## 2018-02-11 DIAGNOSIS — I10 Essential (primary) hypertension: Secondary | ICD-10-CM | POA: Diagnosis not present

## 2018-02-11 NOTE — Patient Instructions (Signed)
Your physician recommends that you return for lab work FASTING to check cholesterol   Your physician wants you to follow-up in: 6 months with Dr. Rennis GoldenHilty. You will receive a reminder letter in MyChart. Please call to schedule an appointment when you receive this reminder.

## 2018-02-11 NOTE — Progress Notes (Signed)
Cardiology Office Note    Date:  02/11/2018   ID:  Javier Rowland, DOB 1961/06/07, MRN [id number]  PCP:  Farris Has, MD  Cardiologist:  Dr. Rennis Golden  No chief complaint on file.   History of Present Illness:    Javier Rowland is a 57 y.o. male with past medical history of HTN and HLD who presents to the office today for hospital follow-up.   He was recently admitted to [facility] from 12/3 - 11/03/2017 for evaluation of chest pain. He reported having episodes of intermittent substernal chest discomfort for the past several weeks lasting for 5-10 minutes at a time. His initial troponin value was found to be elevated at 0.50 and he was admitted for further evaluation of his NSTEMI. He underwent a cardiac catheterization on 11/02/2017 which showed 99% stenosis along the mid LAD. This was treated with placement of two overlapping Synergy DES. He was started on ASA and Brilinta and will be on these for at least 12 months.Lopressor and statin therapy were also added to his medication regimen.    In talking with the patient today, he reports overall doing well since his recent hospitalization. He denies any recurrent chest pain, dyspnea on exertion, orthopnea, PND, lower extremity edema, or palpitations. He has been walking for up to 30 minutes as a time and denies any anginal symptoms with this.   He reports good compliance with his medications and denies missing any recent doses of ASA or Brilinta. He has been following BP closely and it has remained well-controlled by review of his BP diary. BP is well-controlled at 124/60 during today's visit.   02/11/2018  Javier Rowland returns today for follow-up.  Over the past several months he is done well.  He says he has had some intermittent chest discomfort.  It is nothing like the pain he had before.  He took nitro with no significant relief.  He is not sure exactly when to take it.  The pressures been well controlled at 128/72.  EKG shows sinus rhythm today  without any Q waves.  He denies any significant bleeding problems on aspirin and Brilinta.   Past Medical History:  Diagnosis Date  . CAD (coronary artery disease)    a. 10/2017: s/p NSTEMI with 2 overlapping DES to mid-LAD.   Marland Kitchen HTN (hypertension) 11/01/2017  . Hypercholesterolemia   . Hyperlipidemia     Past Surgical History:  Procedure Laterality Date  . CORONARY STENT INTERVENTION N/A 11/02/2017   Procedure: CORONARY STENT INTERVENTION;  Surgeon: Lyn Records, MD;  Location: [facility] INVASIVE CV LAB;  Service: Cardiovascular;  Laterality: N/A;  . LEFT HEART CATH AND CORONARY ANGIOGRAPHY N/A 11/02/2017   Procedure: LEFT HEART CATH AND CORONARY ANGIOGRAPHY;  Surgeon: Lyn Records, MD;  Location: [facility] INVASIVE CV LAB;  Service: Cardiovascular;  Laterality: N/A;    Current Medications: Outpatient Medications Prior to Visit  Medication Sig Dispense Refill  . aspirin 81 MG tablet Take 81 mg by mouth daily.    Marland Kitchen atorvastatin (LIPITOR) 80 MG tablet Take 1 tablet (80 mg total) by mouth daily at 6 PM. 90 tablet 3  . metoprolol tartrate (LOPRESSOR) 25 MG tablet Take 0.5 tablets (12.5 mg total) by mouth 2 (two) times daily. 90 tablet 3  . nitroGLYCERIN (NITROSTAT) 0.4 MG SL tablet Place 1 tablet (0.4 mg total) under the tongue every 5 (five) minutes x 3 doses as needed for chest pain. 25 tablet 2  . pantoprazole (PROTONIX) 40 MG tablet Take  1 tablet (40 mg total) by mouth daily. 90 tablet 3  . ticagrelor (BRILINTA) 90 MG TABS tablet Take 1 tablet (90 mg total) by mouth 2 (two) times daily. 180 tablet 3   No facility-administered medications prior to visit.      Allergies:   Patient has no known allergies.   Social History   Socioeconomic History  . Marital status: Married    Spouse name: None  . Number of children: None  . Years of education: None  . Highest education level: None  Social Needs  . Financial resource strain: None  . Food insecurity - worry: None  . Food insecurity -  inability: None  . Transportation needs - medical: None  . Transportation needs - non-medical: None  Occupational History  . None  Tobacco Use  . Smoking status: Never Smoker  . Smokeless tobacco: Never Used  Substance and Sexual Activity  . Alcohol use: No    Alcohol/week: 0.0 oz  . Drug use: No  . Sexual activity: None  Other Topics Concern  . None  Social History Narrative  . None     Family History:  The patient's family history includes Allergies in his daughter; Colon cancer in his father; Heart disease in his maternal grandfather.   Review of Systems:   Pertinent items noted in HPI and remainder of comprehensive ROS otherwise negative.   Physical Exam:    VS:  BP 128/72   Pulse 60   Ht 5\' 6"  (1.676 m)   Wt 174 lb (78.9 kg)   BMI 28.08 kg/m    General appearance: alert and no distress Neck: no carotid bruit, no JVD and thyroid not enlarged, symmetric, no tenderness/mass/nodules Lungs: clear to auscultation bilaterally Heart: regular rate and rhythm, S1, S2 normal, no murmur, click, rub or gallop Abdomen: soft, non-tender; bowel sounds normal; no masses,  no organomegaly Extremities: extremities normal, atraumatic, no cyanosis or edema Pulses: 2+ and symmetric Skin: Skin color, texture, turgor normal. No rashes or lesions Neurologic: Grossly normal Psych: Pleasant   Wt Readings from Last 3 Encounters:  02/11/18 174 lb (78.9 kg)  11/15/17 174 lb 12.8 oz (79.3 kg)  11/03/17 170 lb 3.1 oz (77.2 kg)     Studies/Labs Reviewed:   EKG:   Normal sinus rhythm at 60-personally reviewed  Recent Labs: 11/03/2017: BUN 14; Creatinine, Ser 0.87; Hemoglobin 15.2; Platelets 246; Potassium 4.0; Sodium 140   Lipid Panel    Component Value Date/Time   CHOL 133 11/02/2017 0508   TRIG 97 11/02/2017 0508   HDL 43 11/02/2017 0508   CHOLHDL 3.1 11/02/2017 0508   VLDL 19 11/02/2017 0508   LDLCALC 71 11/02/2017 0508    Additional studies/ records that were reviewed  today include:   Cardiac Catheterization: 11/02/2017  Severe eccentric 99% mid LAD stenosis beyond which there is TIMI grade II flow.  The most severe lesion lies within a segment of 40% narrowing in the mid vessel.  The LAD is otherwise widely patent  Widely patent left main.  50% segmental stenosis in the second obtuse marginal.  Normal widely patent nondominant right coronary.  Successful PCI of the mid LAD using overlapping synergy 16 x 3.5 and 8 x 3.5 drug-eluting stents postdilated to 3.75 mm in diameter.  99% stenosis reduced to 0% with TIMI grade III flow.  RECOMMENDATIONS:   Aspirin and Brilinta for at least 12 months.  Aggressive risk factor modification: LDL less than 70, screening for diabetes, blood pressure 130/85 mmHg  or less, and others as clinically indicated.  Eligible for discharge in a.m. if clinically stable.  Assessment:    1. Hyperlipidemia LDL goal <70   2. Coronary artery disease involving native coronary artery of native heart without angina pectoris    Plan:   1. Javier Rowland has coronary artery disease status post PCI to the mid LAD with a 99% stenosis.  He also has 50% segmental stenosis in the second obtuse marginal (10/2017).  Done well with medical therapy.  He is due for repeat lipid profile with a goal LDL less than 70.  We will continue dual antiplatelet therapy until December 2018.  Plan to see him back in 6 months or sooner as necessary.  Chrystie NoseKenneth C. Nayelis Bonito, MD, [facility] LL[facility]ACC, FACP  [place]  [facility]  Medical Director of the Advanced Lipid Disorders &  Cardiovascular Risk Reduction Clinic Diplomate of the American Board of Clinical Lipidology Attending Cardiologist  Direct Dial: [phone]  Fax: [phone]  Website:  [email or web address]  Chrystie NoseKenneth C Tennelle Taflinger, MD  02/11/2018 2:34 PM

## 2018-02-12 LAB — LIPID PANEL
CHOLESTEROL TOTAL: 111 mg/dL (ref 100–199)
Chol/HDL Ratio: 2.6 ratio (ref 0.0–5.0)
HDL: 42 mg/dL (ref >39)
LDL Calculated: 48 mg/dL (ref 0–99)
Triglycerides: 105 mg/dL (ref 0–149)
VLDL CHOLESTEROL CAL: 21 mg/dL (ref 5–40)

## 2018-04-26 ENCOUNTER — Other Ambulatory Visit: Payer: Self-pay | Admitting: Internal Medicine

## 2018-04-26 MED ORDER — ASPIRIN 81 MG PO TABS: 81.0000 mg | ORAL_TABLET | Freq: Every day | ORAL | 3 refills | Status: DC

## 2018-04-26 MED ORDER — ATORVASTATIN CALCIUM 80 MG PO TABS: 80.0000 mg | ORAL_TABLET | Freq: Every day | ORAL | 3 refills | Status: DC

## 2018-04-26 MED ORDER — METOPROLOL TARTRATE 25 MG PO TABS: 12.5000 mg | ORAL_TABLET | Freq: Two times a day (BID) | ORAL | 3 refills | Status: DC

## 2018-04-26 NOTE — Telephone Encounter (Signed)
Rx(s) sent to pharmacy electronically.  

## 2018-05-31 ENCOUNTER — Encounter: Payer: Self-pay | Admitting: Internal Medicine

## 2018-08-15 ENCOUNTER — Encounter: Payer: Self-pay | Admitting: Internal Medicine

## 2018-08-15 ENCOUNTER — Ambulatory Visit (INDEPENDENT_AMBULATORY_CARE_PROVIDER_SITE_OTHER): Payer: Managed Care, Other (non HMO) | Admitting: Internal Medicine

## 2018-08-15 VITALS — BP 120/76 | HR 70 | Ht 66.0 in | Wt 175.0 lb

## 2018-08-15 DIAGNOSIS — I10 Essential (primary) hypertension: Secondary | ICD-10-CM

## 2018-08-15 DIAGNOSIS — I2510 Atherosclerotic heart disease of native coronary artery without angina pectoris: Secondary | ICD-10-CM

## 2018-08-15 DIAGNOSIS — E785 Hyperlipidemia, unspecified: Secondary | ICD-10-CM | POA: Diagnosis not present

## 2018-08-15 NOTE — Progress Notes (Signed)
Cardiology Office Note    Date:  08/15/2018   ID:  Javier Rowland, DOB 10-Jul-1961, MRN [id number]  PCP:  Farris HasMorrow, Aaron, MD  Cardiologist:  Dr. Rennis GoldenHilty  CC: No complaints  History of Present Illness:    Javier Rowland is a 57 y.o. male with past medical history of HTN and HLD who presents to the office today for hospital follow-up.   He was recently admitted to [facility] from 12/3 - 11/03/2017 for evaluation of chest pain. He reported having episodes of intermittent substernal chest discomfort for the past several weeks lasting for 5-10 minutes at a time. His initial troponin value was found to be elevated at 0.50 and he was admitted for further evaluation of his NSTEMI. He underwent a cardiac catheterization on 11/02/2017 which showed 99% stenosis along the mid LAD. This was treated with placement of two overlapping Synergy DES. He was started on ASA and Brilinta and will be on these for at least 12 months.Lopressor and statin therapy were also added to his medication regimen.    In talking with the patient today, he reports overall doing well since his recent hospitalization. He denies any recurrent chest pain, dyspnea on exertion, orthopnea, PND, lower extremity edema, or palpitations. He has been walking for up to 30 minutes as a time and denies any anginal symptoms with this.   He reports good compliance with his medications and denies missing any recent doses of ASA or Brilinta. He has been following BP closely and it has remained well-controlled by review of his BP diary. BP is well-controlled at 124/60 during today's visit.   02/11/2018  Javier Rowland returns today for follow-up.  Over the past several months he is done well.  He says he has had some intermittent chest discomfort.  It is nothing like the pain he had before.  He took nitro with no significant relief.  He is not sure exactly when to take it.  The pressures been well controlled at 128/72.  EKG shows sinus rhythm today without any Q  waves.  He denies any significant bleeding problems on aspirin and Brilinta.  08/15/2018  Javier Rowland is seen today in follow-up.  He continues to do well.  He is closing in on a year post stent which is December 2019.  6 months ago he had a lipid profile which showed total cholesterol 111, triglycerides 105, HDL 42 and LDL 48.  He denies any chest pain or worsening shortness of breath.  His goal LDL is less than 70.  Blood pressure is well controlled.   Past Medical History:  Diagnosis Date  . CAD (coronary artery disease)    a. 10/2017: s/p NSTEMI with 2 overlapping DES to mid-LAD.   Marland Kitchen. HTN (hypertension) 11/01/2017  . Hypercholesterolemia   . Hyperlipidemia     Past Surgical History:  Procedure Laterality Date  . CORONARY STENT INTERVENTION N/A 11/02/2017   Procedure: CORONARY STENT INTERVENTION;  Surgeon: Lyn RecordsSmith, Henry W, MD;  [facility];  Service: Cardiovascular;  Laterality: N/A;  . LEFT HEART CATH AND CORONARY ANGIOGRAPHY N/A 11/02/2017   Procedure: LEFT HEART CATH AND CORONARY ANGIOGRAPHY;  Surgeon: Lyn RecordsSmith, Henry W, MD;  Location: [facility] INVASIVE CV LAB;  Service: Cardiovascular;  Laterality: N/A;    Current Medications: Outpatient Medications Prior to Visit  Medication Sig Dispense Refill  . aspirin 81 MG tablet Take 1 tablet (81 mg total) by mouth daily. 90 tablet 3  . atorvastatin (LIPITOR) 80 MG tablet Take  1 tablet (80 mg total) by mouth daily at 6 PM. 90 tablet 3  . metoprolol tartrate (LOPRESSOR) 25 MG tablet Take 0.5 tablets (12.5 mg total) by mouth 2 (two) times daily. 90 tablet 3  . nitroGLYCERIN (NITROSTAT) 0.4 MG SL tablet Place 1 tablet (0.4 mg total) under the tongue every 5 (five) minutes x 3 doses as needed for chest pain. 25 tablet 2  . pantoprazole (PROTONIX) 40 MG tablet Take 1 tablet (40 mg total) by mouth daily. 90 tablet 3  . ticagrelor (BRILINTA) 90 MG TABS tablet Take 1 tablet (90 mg total) by mouth 2 (two) times daily. 180 tablet 3   No  facility-administered medications prior to visit.      Allergies:   Patient has no known allergies.   Social History   Socioeconomic History  . Marital status: Married    Spouse name: Not on file  . Number of children: Not on file  . Years of education: Not on file  . Highest education level: Not on file  Occupational History  . Not on file  Social Needs  . Financial resource strain: Not on file  . Food insecurity:    Worry: Not on file    Inability: Not on file  . Transportation needs:    Medical: Not on file    Non-medical: Not on file  Tobacco Use  . Smoking status: Never Smoker  . Smokeless tobacco: Never Used  Substance and Sexual Activity  . Alcohol use: No    Alcohol/week: 0.0 standard drinks  . Drug use: No  . Sexual activity: Not on file  Lifestyle  . Physical activity:    Days per week: Not on file    Minutes per session: Not on file  . Stress: Not on file  Relationships  . Social connections:    Talks on phone: Not on file    Gets together: Not on file    Attends religious service: Not on file    Active member of club or organization: Not on file    Attends meetings of clubs or organizations: Not on file    Relationship status: Not on file  Other Topics Concern  . Not on file  Social History Narrative  . Not on file     Family History:  The patient's family history includes Allergies in his daughter; Colon cancer in his father; Heart disease in his maternal grandfather.   Review of Systems:   Pertinent items noted in HPI and remainder of comprehensive ROS otherwise negative.   Physical Exam:    VS:  There were no vitals taken for this visit.   General appearance: alert and no distress Neck: no carotid bruit, no JVD and thyroid not enlarged, symmetric, no tenderness/mass/nodules Lungs: clear to auscultation bilaterally Heart: regular rate and rhythm, S1, S2 normal, no murmur, click, rub or gallop Abdomen: soft, non-tender; bowel sounds normal;  no masses,  no organomegaly Extremities: extremities normal, atraumatic, no cyanosis or edema Pulses: 2+ and symmetric Skin: Skin color, texture, turgor normal. No rashes or lesions Neurologic: Grossly normal Psych: Pleasant   Wt Readings from Last 3 Encounters:  02/11/18 174 lb (78.9 kg)  11/15/17 174 lb 12.8 oz (79.3 kg)  11/03/17 170 lb 3.1 oz (77.2 kg)     Studies/Labs Reviewed:   EKG:   Normal sinus rhythm at 70-personally reviewed  Recent Labs: 11/03/2017: BUN 14; Creatinine, Ser 0.87; Hemoglobin 15.2; Platelets 246; Potassium 4.0; Sodium 140   Lipid Panel  Component Value Date/Time   CHOL 111 02/11/2018 1429   TRIG 105 02/11/2018 1429   HDL 42 02/11/2018 1429   CHOLHDL 2.6 02/11/2018 1429   CHOLHDL 3.1 11/02/2017 0508   VLDL 19 11/02/2017 0508   LDLCALC 48 02/11/2018 1429    Additional studies/ records that were reviewed today include:   Cardiac Catheterization: 11/02/2017  Severe eccentric 99% mid LAD stenosis beyond which there is TIMI grade II flow.  The most severe lesion lies within a segment of 40% narrowing in the mid vessel.  The LAD is otherwise widely patent  Widely patent left main.  50% segmental stenosis in the second obtuse marginal.  Normal widely patent nondominant right coronary.  Successful PCI of the mid LAD using overlapping synergy 16 x 3.5 and 8 x 3.5 drug-eluting stents postdilated to 3.75 mm in diameter.  99% stenosis reduced to 0% with TIMI grade III flow.  RECOMMENDATIONS:   Aspirin and Brilinta for at least 12 months.  Aggressive risk factor modification: LDL less than 70, screening for diabetes, blood pressure 130/85 mmHg or less, and others as clinically indicated.  Eligible for discharge in a.m. if clinically stable.  Assessment:    1. Coronary artery disease status post PCI to the LAD (10/2017) 2. Hypertension 3. Dyslipidemia  Plan:   1. Javier Rowland is doing well after PCI to the mid LAD in December 2018.  Blood  pressure is at goal cholesterol is at goal with LDL less than 70.  We will plan to discontinue Brilinta in December and he should maintain on low-dose aspirin.  He will also stay on low-dose beta-blocker and his atorvastatin.  Follow-up with me annually or sooner as necessary.  Chrystie Nose, MD, [facility], FACP  [place]  [facility]  Medical Director of the Advanced Lipid Disorders &  Cardiovascular Risk Reduction Clinic Diplomate of the American Board of Clinical Lipidology Attending Cardiologist  Direct Dial: [phone]  Fax: [phone]  Website:  [email or web address]  Chrystie Nose, MD  08/15/2018 3:46 PM

## 2018-08-15 NOTE — Patient Instructions (Addendum)
Your physician has recommended you make the following change in your medication:  -- over the counter prilosec - take as directed -- stop brilinta after 11/02/2018 (one year post stent)  Your physician wants you to follow-up in: ONE YEAR with Dr. Rennis GoldenHilty. You will receive a reminder letter in the mail two months in advance. If you don't receive a letter, please call our office to schedule the follow-up appointment.

## 2018-11-21 ENCOUNTER — Other Ambulatory Visit: Payer: Self-pay | Admitting: Student

## 2018-11-21 NOTE — Telephone Encounter (Signed)
This is Dr. Hilty's pt 

## 2019-02-02 ENCOUNTER — Other Ambulatory Visit (HOSPITAL_COMMUNITY): Payer: Self-pay | Admitting: Cardiology

## 2019-02-02 NOTE — Telephone Encounter (Signed)
This is Dr. Hilty's pt 

## 2019-04-10 ENCOUNTER — Other Ambulatory Visit: Payer: Self-pay | Admitting: Internal Medicine

## 2019-08-31 ENCOUNTER — Other Ambulatory Visit: Payer: Self-pay

## 2019-08-31 ENCOUNTER — Ambulatory Visit (INDEPENDENT_AMBULATORY_CARE_PROVIDER_SITE_OTHER): Payer: Managed Care, Other (non HMO) | Admitting: Internal Medicine

## 2019-08-31 ENCOUNTER — Encounter: Payer: Self-pay | Admitting: Internal Medicine

## 2019-08-31 VITALS — BP 134/66 | HR 64 | Temp 97.0°F | Ht 66.0 in | Wt 170.0 lb

## 2019-08-31 DIAGNOSIS — I10 Essential (primary) hypertension: Secondary | ICD-10-CM | POA: Diagnosis not present

## 2019-08-31 DIAGNOSIS — E785 Hyperlipidemia, unspecified: Secondary | ICD-10-CM

## 2019-08-31 DIAGNOSIS — I2510 Atherosclerotic heart disease of native coronary artery without angina pectoris: Secondary | ICD-10-CM

## 2019-08-31 NOTE — Progress Notes (Signed)
Cardiology Office Note    Date:  08/31/2019   ID:  Javier Rowland, DOB [birth year], MRN [id number]  PCP:  Javier Has, MD  Cardiologist:  Dr. Rennis Rowland  CC: No complaints  History of Present Illness:    Javier Rowland is a 58 y.o. male with past medical history of HTN and HLD who presents to the office today for hospital follow-up.   He was recently admitted to [facility] from 12/3 - 11/03/2017 for evaluation of chest pain. He reported having episodes of intermittent substernal chest discomfort for the past several weeks lasting for 5-10 minutes at a time. His initial troponin value was found to be elevated at 0.50 and he was admitted for further evaluation of his NSTEMI. He underwent a cardiac catheterization on 11/02/2017 which showed 99% stenosis along the mid LAD. This was treated with placement of two overlapping Synergy DES. He was started on ASA and Brilinta and will be on these for at least 12 months.Lopressor and statin therapy were also added to his medication regimen.    In talking with the patient today, he reports overall doing well since his recent hospitalization. He denies any recurrent chest pain, dyspnea on exertion, orthopnea, PND, lower extremity edema, or palpitations. He Rowland been walking for up to 30 minutes as a time and denies any anginal symptoms with this.   He reports good compliance with his medications and denies missing any recent doses of ASA or Brilinta. He Rowland been following BP closely and it Rowland remained well-controlled by review of his BP diary. BP is well-controlled at 124/60 during today's visit.   02/11/2018  Mr. Javier Rowland returns today for follow-up.  Over the past several months he is done well.  He says he Rowland had some intermittent chest discomfort.  It is nothing like the pain he had before.  He took nitro with no significant relief.  He is not sure exactly when to take it.  The pressures been well controlled at 128/72.  EKG shows sinus rhythm today without any Q  waves.  He denies any significant bleeding problems on aspirin and Brilinta.  08/15/2018  Mr. Javier Rowland is seen today in follow-up.  He continues to do well.  He is closing in on a year post stent which is December 2019.  6 months ago he had a lipid profile which showed total cholesterol 111, triglycerides 105, HDL 42 and LDL 48.  He denies any chest pain or worsening shortness of breath.  His goal LDL is less than 70.  Blood pressure is well controlled.  08/31/2019  Javier Rowland returns today for follow-up.  He continues to do well without any chest pain or worsening shortness of breath.  Blood pressure appears reasonably well controlled today and he says it typically runs in the 120-130 systolic range at home.  He Rowland come off of dual antiplatelet therapy but remains on aspirin.  His last lipid profile was a year ago and he will be due for this again.  He is also on metoprolol.  He denies any need for nitroglycerin.   Past Medical History:  Diagnosis Date  . CAD (coronary artery disease)    a. 10/2017: s/p NSTEMI with 2 overlapping DES to mid-LAD.   Javier Rowland HTN (hypertension) 11/01/2017  . Hypercholesterolemia   . Hyperlipidemia     Past Surgical History:  Procedure Laterality Date  . CORONARY STENT INTERVENTION N/A 11/02/2017   Procedure: CORONARY STENT INTERVENTION;  Surgeon: Lyn Records, MD;  Location:  [place] INVASIVE CV [facility];  Service: Cardiovascular;  Laterality: N/A;  . LEFT HEART CATH AND CORONARY ANGIOGRAPHY N/A 11/02/2017   Procedure: LEFT HEART CATH AND CORONARY ANGIOGRAPHY;  Surgeon: Belva Crome, MD;  Location: [place] CV LAB;  Service: Cardiovascular;  Laterality: N/A;    Current Medications: Outpatient Medications Prior to Visit  Medication Sig Dispense Refill  . aspirin 81 MG tablet Take 1 tablet (81 mg total) by mouth daily. 90 tablet 3  . atorvastatin (LIPITOR) 80 MG tablet TAKE 1 TABLET BY MOUTH DAILY AT 6PM 90 tablet 1  . metoprolol tartrate (LOPRESSOR) 25 MG tablet TAKE  ONE-HALF (1/2) TABLET BY MOUTH TWICE DAILY 90 tablet 1  . nitroGLYCERIN (NITROSTAT) 0.4 MG SL tablet PLACE 1 TABLET UNDER THE TONGUE EVERY 5 MINUTES FOR 3 DOSES AS NEEDED FOR CHEST PAIN 25 tablet 2  . ticagrelor (BRILINTA) 90 MG TABS tablet Take 1 tablet (90 mg total) by mouth 2 (two) times daily. (Patient not taking: Reported on 08/31/2019) 180 tablet 3   No facility-administered medications prior to visit.      Allergies:   Patient Rowland no known allergies.   Social History   Socioeconomic History  . Marital status: Married    Spouse name: Not on file  . Number of children: Not on file  . Years of education: Not on file  . Highest education level: Not on file  Occupational History  . Not on file  Social Needs  . Financial resource strain: Not on file  . Food insecurity    Worry: Not on file    Inability: Not on file  . Transportation needs    Medical: Not on file    Non-medical: Not on file  Tobacco Use  . Smoking status: Never Smoker  . Smokeless tobacco: Never Used  Substance and Sexual Activity  . Alcohol use: No    Alcohol/week: 0.0 standard drinks  . Drug use: No  . Sexual activity: Not on file  Lifestyle  . Physical activity    Days per week: Not on file    Minutes per session: Not on file  . Stress: Not on file  Relationships  . Social Herbalist on phone: Not on file    Gets together: Not on file    Attends religious service: Not on file    Active member of club or organization: Not on file    Attends meetings of clubs or organizations: Not on file    Relationship status: Not on file  Other Topics Concern  . Not on file  Social History Narrative  . Not on file     Family History:  The patient's family history includes Allergies in his daughter; Colon cancer in his father; Heart disease in his maternal grandfather.   Review of Systems:   Pertinent items noted in HPI and remainder of comprehensive ROS otherwise negative.   Physical Exam:     VS:  BP 134/66   Pulse 64   Temp (!) 97 F (36.1 C) (Temporal)   Ht 5\' 6"  (1.676 m)   Wt 170 lb (77.1 kg)   SpO2 99%   BMI 27.44 kg/m    General appearance: alert and no distress Neck: no carotid bruit, no JVD and thyroid not enlarged, symmetric, no tenderness/mass/nodules Lungs: clear to auscultation bilaterally Heart: regular rate and rhythm, S1, S2 normal, no murmur, click, rub or gallop Abdomen: soft, non-tender; bowel sounds normal; no masses,  no organomegaly Extremities: extremities  normal, atraumatic, no cyanosis or edema Pulses: 2+ and symmetric Skin: Skin color, texture, turgor normal. No rashes or lesions Neurologic: Grossly normal Psych: Pleasant   Wt Readings from Last 3 Encounters:  08/31/19 170 lb (77.1 kg)  08/15/18 175 lb (79.4 kg)  02/11/18 174 lb (78.9 kg)     Studies/Labs Reviewed:   EKG:   Normal sinus rhythm at 64-personally reviewed  Recent Labs: No results found for requested labs within last 8760 hours.   Lipid Panel    Component Value Date/Time   CHOL 111 02/11/2018 1429   TRIG 105 02/11/2018 1429   HDL 42 02/11/2018 1429   CHOLHDL 2.6 02/11/2018 1429   CHOLHDL 3.1 11/02/2017 0508   VLDL 19 11/02/2017 0508   LDLCALC 48 02/11/2018 1429    Additional studies/ records that were reviewed today include:   Cardiac Catheterization: 11/02/2017  Severe eccentric 99% mid LAD stenosis beyond which there is TIMI grade II flow.  The most severe lesion lies within a segment of 40% narrowing in the mid vessel.  The LAD is otherwise widely patent  Widely patent left main.  50% segmental stenosis in the second obtuse marginal.  Normal widely patent nondominant right coronary.  Successful PCI of the mid LAD using overlapping synergy 16 x 3.5 and 8 x 3.5 drug-eluting stents postdilated to 3.75 mm in diameter.  99% stenosis reduced to 0% with TIMI grade III flow.  RECOMMENDATIONS:   Aspirin and Brilinta for at least 12 months.  Aggressive risk  factor modification: LDL less than 70, screening for diabetes, blood pressure 130/85 mmHg or less, and others as clinically indicated.  Eligible for discharge in a.m. if clinically stable.  Assessment:    1. Coronary artery disease status post PCI to the LAD (10/2017) 2. Hypertension 3. Dyslipidemia  Plan:   Mr. Yetta BarreJones continues to do well without any recurrent chest pain or worsening shortness of breath.  Blood pressures been well controlled.  He is due for repeat lipid testing which she will obtain from his primary care provider.  No changes to his medications today.  Plan follow-up with me annually or sooner as necessary.  Chrystie NoseKenneth C. Clarissa Laird, MD, [facility] County Memorial HospitalFACC, FACP    [facility]  Medical Director of the Advanced Lipid Disorders &  Cardiovascular Risk Reduction Clinic Diplomate of the American Board of Clinical Lipidology Attending Cardiologist  Direct Dial: [phone]  Fax: [phone]  Website:  [email or web address]  Chrystie NoseKenneth C Cheray Pardi, MD  08/31/2019 3:36 PM

## 2019-08-31 NOTE — Patient Instructions (Signed)
Medication Instructions:  NO CHANGE If you need a refill on your cardiac medications before your next appointment, please call your pharmacy.   Lab work: If you have labs (blood work) drawn today and your tests are completely normal, you will receive your results only by: Marland Kitchen MyChart Message (if you have MyChart) OR . A paper copy in the mail If you have any lab test that is abnormal or we need to change your treatment, we will call you to review the results.  Follow-Up: At [facility], you and your health needs are our priority.  As part of our continuing mission to provide you with exceptional heart care, we have created designated Provider Care Teams.  These Care Teams include your primary Cardiologist (physician) and Advanced Practice Providers (APPs -  Physician Assistants and Nurse Practitioners) who all work together to provide you with the care you need, when you need it. You will need a follow up appointment in 12 months.  Please call our office 2 months in advance to schedule this appointment.  You may see Pixie Casino, MD or one of the following Advanced Practice Providers on your designated Care Team:   Kerin Ransom, PA-C Roby Lofts, [place] . Sande Rives, PA-C

## 2019-10-16 ENCOUNTER — Other Ambulatory Visit: Payer: Self-pay | Admitting: Internal Medicine

## 2019-12-27 DIAGNOSIS — Z800 Family history of malignant neoplasm of digestive organs: Secondary | ICD-10-CM | POA: Diagnosis not present

## 2019-12-27 DIAGNOSIS — Z8679 Personal history of other diseases of the circulatory system: Secondary | ICD-10-CM | POA: Diagnosis not present

## 2020-01-03 DIAGNOSIS — Z1159 Encounter for screening for other viral diseases: Secondary | ICD-10-CM | POA: Diagnosis not present

## 2020-01-08 DIAGNOSIS — K5730 Diverticulosis of large intestine without perforation or abscess without bleeding: Secondary | ICD-10-CM | POA: Diagnosis not present

## 2020-01-08 DIAGNOSIS — K6289 Other specified diseases of anus and rectum: Secondary | ICD-10-CM | POA: Diagnosis not present

## 2020-01-08 DIAGNOSIS — D123 Benign neoplasm of transverse colon: Secondary | ICD-10-CM | POA: Diagnosis not present

## 2020-01-08 DIAGNOSIS — Z1211 Encounter for screening for malignant neoplasm of colon: Secondary | ICD-10-CM | POA: Diagnosis not present

## 2020-01-08 DIAGNOSIS — Z800 Family history of malignant neoplasm of digestive organs: Secondary | ICD-10-CM | POA: Diagnosis not present

## 2020-02-02 ENCOUNTER — Ambulatory Visit: Payer: Self-pay | Attending: Internal Medicine

## 2020-02-02 DIAGNOSIS — Z23 Encounter for immunization: Secondary | ICD-10-CM

## 2020-02-02 NOTE — Progress Notes (Signed)
   Covid-19 Vaccination Clinic  Name:  Javier Rowland    MRN: [id number] DOB: 1961-04-29  02/02/2020  Mr. Javier Rowland was observed post Covid-19 immunization for 15 minutes without incident. He was provided with Vaccine Information Sheet and instruction to access the V-Safe system.   Mr. Javier Rowland was instructed to call 911 with any severe reactions post vaccine: Marland Kitchen Difficulty breathing  . Swelling of face and throat  . A fast heartbeat  . A bad rash all over body  . Dizziness and weakness   Immunizations Administered    Name Date Dose VIS Date Route   Pfizer COVID-19 Vaccine 02/02/2020  6:58 PM 0.3 mL 11/10/2019 Intramuscular   Manufacturer: [employer], [employer]   Lot: [id number]   NDC: [address]-2

## 2020-02-05 ENCOUNTER — Other Ambulatory Visit: Payer: Self-pay | Admitting: Internal Medicine

## 2020-02-09 ENCOUNTER — Other Ambulatory Visit: Payer: Self-pay

## 2020-02-09 MED ORDER — ASPIRIN 81 MG PO TBEC: 81.0000 mg | DELAYED_RELEASE_TABLET | Freq: Every day | ORAL | 1 refills | Status: DC

## 2020-02-09 MED ORDER — METOPROLOL TARTRATE 25 MG PO TABS: 12.5000 mg | ORAL_TABLET | Freq: Two times a day (BID) | ORAL | 1 refills | Status: DC

## 2020-02-09 MED ORDER — ATORVASTATIN CALCIUM 80 MG PO TABS: ORAL_TABLET | ORAL | 1 refills | Status: DC

## 2020-02-09 NOTE — Addendum Note (Signed)
Addended by: Honestie Kulik, Elmarie Shiley L on: 02/09/2020 01:16 PM   Modules accepted: Orders

## 2020-02-09 NOTE — Telephone Encounter (Signed)
Spoke with pt informing him his rx for Metoprolol Tartrate 25 mg sent to [facility] on [address].

## 2020-03-05 ENCOUNTER — Ambulatory Visit: Payer: Self-pay | Attending: Internal Medicine

## 2020-03-05 DIAGNOSIS — Z23 Encounter for immunization: Secondary | ICD-10-CM

## 2020-03-05 NOTE — Progress Notes (Signed)
   Covid-19 Vaccination Clinic  Name:  Javier Rowland    MRN: [id number] DOB: 1960/12/15  03/05/2020  [name] was observed post Covid-19 immunization for 15 minutes without incident. He was provided with Vaccine Information Sheet and instruction to access the V-Safe system.   [name] was instructed to call 911 with any severe reactions post vaccine: Marland Kitchen Difficulty breathing  . Swelling of face and throat  . A fast heartbeat  . A bad rash all over body  . Dizziness and weakness   Immunizations Administered    Name Date Dose VIS Date Route   Pfizer COVID-19 Vaccine 03/05/2020  4:11 PM 0.3 mL 11/10/2019 Intramuscular   Manufacturer: [employer], [employer]   Lot: [id number]   NDC: [address]-5

## 2020-05-21 DIAGNOSIS — I2510 Atherosclerotic heart disease of native coronary artery without angina pectoris: Secondary | ICD-10-CM | POA: Diagnosis not present

## 2020-05-21 DIAGNOSIS — R5383 Other fatigue: Secondary | ICD-10-CM | POA: Diagnosis not present

## 2020-06-20 ENCOUNTER — Encounter: Payer: Self-pay | Admitting: Internal Medicine

## 2020-06-20 ENCOUNTER — Telehealth (INDEPENDENT_AMBULATORY_CARE_PROVIDER_SITE_OTHER): Payer: BC Managed Care – PPO | Admitting: Internal Medicine

## 2020-06-20 DIAGNOSIS — I2510 Atherosclerotic heart disease of native coronary artery without angina pectoris: Secondary | ICD-10-CM

## 2020-06-20 DIAGNOSIS — E785 Hyperlipidemia, unspecified: Secondary | ICD-10-CM | POA: Diagnosis not present

## 2020-06-20 DIAGNOSIS — I10 Essential (primary) hypertension: Secondary | ICD-10-CM

## 2020-06-20 MED ORDER — ATORVASTATIN CALCIUM 40 MG PO TABS: ORAL_TABLET | ORAL | 3 refills | Status: DC

## 2020-06-20 NOTE — Patient Instructions (Signed)
Medication Instructions:  DECREASE atorvastatin to 40mg  daily  *If you need a refill on your cardiac medications before your next appointment, please call your pharmacy*   Lab Work: FASTING lipid panel before your next visit  If you have labs (blood work) drawn today and your tests are completely normal, you will receive your results only by: MyChart Message (if you have MyChart) OR . A paper copy in the mail If you have any lab test that is abnormal or we need to change your treatment, we will call you to review the results.   Follow-Up: At [facility], you and your health needs are our priority.  As part of our continuing mission to provide you with exceptional heart care, we have created designated Provider Care Teams.  These Care Teams include your primary Cardiologist (physician) and Advanced Practice Providers (APPs -  Physician Assistants and Nurse Practitioners) who all work together to provide you with the care you need, when you need it.  We recommend signing up for the patient portal called "MyChart".  Sign up information is provided on this After Visit Summary.  MyChart is used to connect with patients for Virtual Visits (Telemedicine).  Patients are able to view lab/test results, encounter notes, upcoming appointments, etc.  Non-urgent messages can be sent to your provider as well.   To learn more about what you can do with MyChart, go to [facility].    Your next appointment:   October 2021  The format for your next appointment:   In Person  Provider:   You may see November 2021, MD or one of the following Advanced Practice Providers on your designated Care Team:    Chrystie Nose, PA-C  Azalee Course, PA-C or   Micah Flesher, Judy Pimple    Other Instructions

## 2020-06-20 NOTE — Progress Notes (Signed)
Virtual Visit via Telephone Note   This visit type was conducted due to national recommendations for restrictions regarding the COVID-19 Pandemic (e.g. social distancing) in an effort to limit this patient's exposure and mitigate transmission in our community.  Due to his co-morbid illnesses, this patient is at least at moderate risk for complications without adequate follow up.  This format is felt to be most appropriate for this patient at this time.  The patient did not have access to video technology/had technical difficulties with video requiring transitioning to audio format only (telephone).  All issues noted in this document were discussed and addressed.  No physical exam could be performed with this format.  Please refer to the patient's chart for his  consent to telehealth for [facility].   Evaluation Performed:  Telephone follow-up  Date:  06/20/2020   ID:  Javier Rowland, DOB 1961-07-20, MRN [id number]  Patient Location:  [address] [place] [address]  Provider location:   [address] [place], [place] [address]  PCP:  Farris Has, MD  Cardiologist:  Chrystie Nose, MD Electrophysiologist:  None   Chief Complaint:  Fatigue, leg weakness  History of Present Illness:    Javier Rowland is a 59 y.o. male who presents via audio/video conferencing for a telehealth visit today.  He was admitted to [facility] from 12/3 - 11/03/2017 for evaluation of chest pain. He reported having episodes of intermittent substernal chest discomfort for the past several weeks lasting for 5-10 minutes at a time. His initial troponin value was found to be elevated at 0.50 and he was admitted for further evaluation of his NSTEMI. He underwent a cardiac catheterization on 11/02/2017 which showed 99% stenosis along the mid LAD. This was treated with placement of two overlapping Synergy DES. He was started on ASA and Brilinta and will be on these for at least 12  months.Lopressor and statin therapy were also added to his medication regimen.    06/20/2020  Javier Rowland is seen today via telephone visit for follow-up.  Several weeks ago he had an episode of fatigue and leg weakness.  This persisted for several weeks and then spontaneously improved.  He had made an appointment earlier to see me.  He now says he feels back pretty much to normal.  He did see his PCP ordered EKG which was normal.  Lab work was also performed including a metabolic profile which was normal.  His last lipid profile was in October 2020 which showed total cholesterol 103, HDL 41, triglycerides 62 and LDL 49.  The patient does not have symptoms concerning for COVID-19 infection (fever, chills, cough, or new SHORTNESS OF BREATH).    Prior CV studies:   The following studies were reviewed today:  Lab work reviewed  PMHx:  Past Medical History:  Diagnosis Date  . CAD (coronary artery disease)    a. 10/2017: s/p NSTEMI with 2 overlapping DES to mid-LAD.   Marland Kitchen HTN (hypertension) 11/01/2017  . Hypercholesterolemia   . Hyperlipidemia     Past Surgical History:  Procedure Laterality Date  . CORONARY STENT INTERVENTION N/A 11/02/2017   Procedure: CORONARY STENT INTERVENTION;  Surgeon: Lyn Records, MD;  Location: [facility] CV LAB;  Service: Cardiovascular;  Laterality: N/A;  . LEFT HEART CATH AND CORONARY ANGIOGRAPHY N/A 11/02/2017   Procedure: LEFT HEART CATH AND CORONARY ANGIOGRAPHY;  Surgeon: Lyn Records, MD;  Location: [facility] INVASIVE CV LAB;  Service: Cardiovascular;  Laterality: N/A;  FAMHx:  Family History  Problem Relation Age of Onset  . Allergies Daughter   . Heart disease Maternal Grandfather   . Colon cancer Father     SOCHx:   reports that he has never smoked. He has never used smokeless tobacco. He reports that he does not drink alcohol and does not use drugs.  ALLERGIES:  No Known Allergies  MEDS:  Current Meds  Medication Sig  . aspirin (ASPIRIN LOW  DOSE) 81 MG EC tablet Take 1 tablet (81 mg total) by mouth daily.  Marland Kitchen atorvastatin (LIPITOR) 40 MG tablet Take 1 tablet by mouth everyday at 6 pm  . metoprolol tartrate (LOPRESSOR) 25 MG tablet Take 0.5 tablets (12.5 mg total) by mouth 2 (two) times daily.  . nitroGLYCERIN (NITROSTAT) 0.4 MG SL tablet PLACE 1 TABLET UNDER THE TONGUE EVERY 5 MINUTES FOR 3 DOSES AS NEEDED FOR CHEST PAIN  . [DISCONTINUED] atorvastatin (LIPITOR) 80 MG tablet Take 1 tablet by mouth everyday at 6 pm     ROS: Pertinent items noted in HPI and remainder of comprehensive ROS otherwise negative.  Labs/Other Tests and Data Reviewed:    Recent Labs: No results found for requested labs within last 8760 hours.   Recent Lipid Panel Lab Results  Component Value Date/Time   CHOL 111 02/11/2018 02:29 PM   TRIG 105 02/11/2018 02:29 PM   HDL 42 02/11/2018 02:29 PM   CHOLHDL 2.6 02/11/2018 02:29 PM   CHOLHDL 3.1 11/02/2017 05:08 AM   LDLCALC 48 02/11/2018 02:29 PM    Wt Readings from Last 3 Encounters:  08/31/19 170 lb (77.1 kg)  08/15/18 175 lb (79.4 kg)  02/11/18 174 lb (78.9 kg)     Exam:    Vital Signs:  There were no vitals taken for this visit.   Exam deferred due to telephone visit  ASSESSMENT & PLAN:    1. Coronary artery disease status post PCI to the LAD (10/2017) 2. Hypertension 3. Dyslipidemia  Javier Rowland had a recent episode of fatigue and leg weakness.  This seemed to have resolved spontaneously.  He had been Covid vaccinated, so I do not suspect if this.  It is unclear what the etiology was although work-up by his PCP was negative.  His lipids are probably better treated and they need to be.  I wonder if he could have had a statin side effect.  I would recommend decreasing his atorvastatin from 80 to 40 mg daily, repeat lipids in 3 months and follow-up on a regularly scheduled appointment in October.  COVID-19 Education: The signs and symptoms of COVID-19 were discussed with the patient and how  to seek care for testing (follow up with PCP or arrange E-visit).  The importance of social distancing was discussed today.  Patient Risk:   After full review of this patients clinical status, I feel that they are at least moderate risk at this time.  Time:   Today, I have spent 25 minutes with the patient with telehealth technology discussing dyslipidemia, fatigue, leg weakness, coronary artery disease.     Medication Adjustments/Labs and Tests Ordered: Current medicines are reviewed at length with the patient today.  Concerns regarding medicines are outlined above.   Tests Ordered: No orders of the defined types were placed in this encounter.   Medication Changes: Meds ordered this encounter  Medications  . atorvastatin (LIPITOR) 40 MG tablet    Sig: Take 1 tablet by mouth everyday at 6 pm    Dispense:  90  tablet    Refill:  3    Disposition:  in 3 month(s)  Chrystie Nose, MD, [facility], FACP    [facility]  Medical Director of the Advanced Lipid Disorders &  Cardiovascular Risk Reduction Clinic Diplomate of the American Board of Clinical Lipidology Attending Cardiologist  Direct Dial: [phone]  Fax: [phone]  Website:  [email or web address]  Chrystie Nose, MD  06/20/2020 9:28 AM

## 2020-08-03 ENCOUNTER — Other Ambulatory Visit: Payer: Self-pay | Admitting: Internal Medicine

## 2020-08-29 DIAGNOSIS — M25562 Pain in left knee: Secondary | ICD-10-CM | POA: Diagnosis not present

## 2020-08-29 DIAGNOSIS — Z23 Encounter for immunization: Secondary | ICD-10-CM | POA: Diagnosis not present

## 2020-09-10 DIAGNOSIS — E785 Hyperlipidemia, unspecified: Secondary | ICD-10-CM | POA: Diagnosis not present

## 2020-09-10 LAB — LIPID PANEL
Chol/HDL Ratio: 2.7 ratio (ref 0.0–5.0)
Cholesterol, Total: 115 mg/dL (ref 100–199)
HDL: 43 mg/dL (ref >39)
LDL Chol Calc (NIH): 55 mg/dL (ref 0–99)
Triglycerides: 85 mg/dL (ref 0–149)
VLDL Cholesterol Cal: 17 mg/dL (ref 5–40)

## 2020-09-17 DIAGNOSIS — M25562 Pain in left knee: Secondary | ICD-10-CM | POA: Diagnosis not present

## 2020-09-20 ENCOUNTER — Other Ambulatory Visit: Payer: Self-pay

## 2020-09-20 ENCOUNTER — Encounter: Payer: Self-pay | Admitting: Internal Medicine

## 2020-09-20 ENCOUNTER — Ambulatory Visit (INDEPENDENT_AMBULATORY_CARE_PROVIDER_SITE_OTHER): Payer: BC Managed Care – PPO | Admitting: Internal Medicine

## 2020-09-20 VITALS — BP 108/62 | HR 65 | Ht 66.0 in | Wt 174.0 lb

## 2020-09-20 DIAGNOSIS — I10 Essential (primary) hypertension: Secondary | ICD-10-CM

## 2020-09-20 DIAGNOSIS — E785 Hyperlipidemia, unspecified: Secondary | ICD-10-CM

## 2020-09-20 DIAGNOSIS — I2510 Atherosclerotic heart disease of native coronary artery without angina pectoris: Secondary | ICD-10-CM | POA: Diagnosis not present

## 2020-09-20 NOTE — Patient Instructions (Signed)

## 2020-09-20 NOTE — Progress Notes (Signed)
Cardiology Office Note    Date:  09/20/2020   ID:  Javier Rowland, DOB 1961/09/12, MRN [id number]  PCP:  Farris Has, MD  Cardiologist:  Dr. Rennis Golden  CC: No complaints  History of Present Illness:    Javier Rowland is a 59 y.o. male with past medical history of HTN and HLD who presents to the office today for hospital follow-up.   He was recently admitted to [facility] from 12/3 - 11/03/2017 for evaluation of chest pain. He reported having episodes of intermittent substernal chest discomfort for the past several weeks lasting for 5-10 minutes at a time. His initial troponin value was found to be elevated at 0.50 and he was admitted for further evaluation of his NSTEMI. He underwent a cardiac catheterization on 11/02/2017 which showed 99% stenosis along the mid LAD. This was treated with placement of two overlapping Synergy DES. He was started on ASA and Brilinta and will be on these for at least 12 months.Lopressor and statin therapy were also added to his medication regimen.    In talking with the patient today, he reports overall doing well since his recent hospitalization. He denies any recurrent chest pain, dyspnea on exertion, orthopnea, PND, lower extremity edema, or palpitations. He has been walking for up to 30 minutes as a time and denies any anginal symptoms with this.   He reports good compliance with his medications and denies missing any recent doses of ASA or Brilinta. He has been following BP closely and it has remained well-controlled by review of his BP diary. BP is well-controlled at 124/60 during today's visit.   02/11/2018  Javier Rowland returns today for follow-up.  Over the past several months he is done well.  He says he has had some intermittent chest discomfort.  It is nothing like the pain he had before.  He took nitro with no significant relief.  He is not sure exactly when to take it.  The pressures been well controlled at 128/72.  EKG shows sinus rhythm today without any  Q waves.  He denies any significant bleeding problems on aspirin and Brilinta.  08/15/2018  Javier Rowland is seen today in follow-up.  He continues to do well.  He is closing in on a year post stent which is December 2019.  6 months ago he had a lipid profile which showed total cholesterol 111, triglycerides 105, HDL 42 and LDL 48.  He denies any chest pain or worsening shortness of breath.  His goal LDL is less than 70.  Blood pressure is well controlled.  08/31/2019  Javier Rowland returns today for follow-up.  He continues to do well without any chest pain or worsening shortness of breath.  Blood pressure appears reasonably well controlled today and he says it typically runs in the 120-130 systolic range at home.  He has come off of dual antiplatelet therapy but remains on aspirin.  His last lipid profile was a year ago and he will be due for this again.  He is also on metoprolol.  He denies any need for nitroglycerin.  09/20/2020  Javier Rowland is seen today in follow-up.  I saw him this past summer via a telemedicine visit and was having some fatigue and leg weakness.  I felt that his cholesterol had actually been very well treated in fact may be lower than he needed to.  I recommended decreasing his atorvastatin from 80 to 40 mg daily.  We just repeated his lipids which now show a  total cholesterol 115, HDL 43, LDL 55 and triglycerides 85.  He does note some mild improvement in his leg symptoms.   Past Medical History:  Diagnosis Date  . CAD (coronary artery disease)    a. 10/2017: s/p NSTEMI with 2 overlapping DES to mid-LAD.   Mar[place] Kitchen HTN (hypertension) 11/01/2017  . Hypercholesterolemia   . Hyperlipidemia     Past Surgical History:  Procedure Laterality Date  . CORONARY STENT INTERVENTION N/A 11/02/2017   Procedure: CORONARY STENT INTERVENTION;  Surgeon: Lyn Records, MD;  Location: [facility] INVASIVE CV LAB;  Service: Cardiovascular;  Laterality: N/A;  . LEFT HEART CATH AND CORONARY ANGIOGRAPHY N/A  11/02/2017   Procedure: LEFT HEART CATH AND CORONARY ANGIOGRAPHY;  Surgeon: Lyn Records, MD;  Location: [facility] INVASIVE CV LAB;  Service: Cardiovascular;  Laterality: N/A;    Current Medications: Outpatient Medications Prior to Visit  Medication Sig Dispense Refill  . ASPIRIN LOW DOSE 81 MG EC tablet TAKE 1 TABLET BY MOUTH EVERY DAY 90 tablet 3  . atorvastatin (LIPITOR) 40 MG tablet Take 1 tablet by mouth everyday at 6 pm 90 tablet 3  . metoprolol tartrate (LOPRESSOR) 25 MG tablet TAKE 0.5 TABLETS (12.5 MG TOTAL) BY MOUTH 2 (TWO) TIMES DAILY. 90 tablet 3  . nitroGLYCERIN (NITROSTAT) 0.4 MG SL tablet PLACE 1 TABLET UNDER THE TONGUE EVERY 5 MINUTES FOR 3 DOSES AS NEEDED FOR CHEST PAIN 25 tablet 2   No facility-administered medications prior to visit.     Allergies:   Patient has no known allergies.   Social History   Socioeconomic History  . Marital status: Married    Spouse name: Not on file  . Number of children: Not on file  . Years of education: Not on file  . Highest education level: Not on file  Occupational History  . Not on file  Tobacco Use  . Smoking status: Never Smoker  . Smokeless tobacco: Never Used  Substance and Sexual Activity  . Alcohol use: No    Alcohol/week: 0.0 standard drinks  . Drug use: No  . Sexual activity: Not on file  Other Topics Concern  . Not on file  Social History Narrative  . Not on file   Social Determinants of Health   Financial Resource Strain:   . Difficulty of Paying Living Expenses: Not on file  Food Insecurity:   . Worried About Programme researcher, broadcasting/film/video in the Last Year: Not on file  . Ran Out of Food in the Last Year: Not on file  Transportation Needs:   . Lack of Transportation (Medical): Not on file  . Lack of Transportation (Non-Medical): Not on file  Physical Activity:   . Days of Exercise per Week: Not on file  . Minutes of Exercise per Session: Not on file  Stress:   . Feeling of Stress : Not on file  Social Connections:     . Frequency of Communication with Friends and Family: Not on file  . Frequency of Social Gatherings with Friends and Family: Not on file  . Attends Religious Services: Not on file  . Active Member of Clubs or Organizations: Not on file  . Attends Banker Meetings: Not on file  . Marital Status: Not on file     Family History:  The patient's family history includes Allergies in his daughter; Colon cancer in his father; Heart disease in his maternal grandfather.   Review of Systems:   Pertinent items noted in HPI and remainder  of comprehensive ROS otherwise negative.   Physical Exam:    VS:  BP 108/62 (BP Location: Left Arm, Patient Position: Sitting, Cuff Size: Normal)   Pulse 65   Ht 5\' 6"  (1.676 m)   Wt 174 lb (78.9 kg)   BMI 28.08 kg/m    General appearance: alert and no distress Neck: no carotid bruit, no JVD and thyroid not enlarged, symmetric, no tenderness/mass/nodules Lungs: clear to auscultation bilaterally Heart: regular rate and rhythm, S1, S2 normal, no murmur, click, rub or gallop Abdomen: soft, non-tender; bowel sounds normal; no masses,  no organomegaly Extremities: extremities normal, atraumatic, no cyanosis or edema Pulses: 2+ and symmetric Skin: Skin color, texture, turgor normal. No rashes or lesions Neurologic: Grossly normal Psych: Pleasant   Wt Readings from Last 3 Encounters:  09/20/20 174 lb (78.9 kg)  08/31/19 170 lb (77.1 kg)  08/15/18 175 lb (79.4 kg)     Studies/Labs Reviewed:   EKG:   Normal sinus rhythm at 65-personally reviewed  Recent Labs: No results found for requested labs within last 8760 hours.   Lipid Panel    Component Value Date/Time   CHOL 115 09/10/2020 0809   TRIG 85 09/10/2020 0809   HDL 43 09/10/2020 0809   CHOLHDL 2.7 09/10/2020 0809   CHOLHDL 3.1 11/02/2017 0508   VLDL 19 11/02/2017 0508   LDLCALC 55 09/10/2020 0809    Additional studies/ records that were reviewed today include:   Cardiac  Catheterization: 11/02/2017  Severe eccentric 99% mid LAD stenosis beyond which there is TIMI grade II flow.  The most severe lesion lies within a segment of 40% narrowing in the mid vessel.  The LAD is otherwise widely patent  Widely patent left main.  50% segmental stenosis in the second obtuse marginal.  Normal widely patent nondominant right coronary.  Successful PCI of the mid LAD using overlapping synergy 16 x 3.5 and 8 x 3.5 drug-eluting stents postdilated to 3.75 mm in diameter.  99% stenosis reduced to 0% with TIMI grade III flow.  RECOMMENDATIONS:   Aspirin and Brilinta for at least 12 months.  Aggressive risk factor modification: LDL less than 70, screening for diabetes, blood pressure 130/85 mmHg or less, and others as clinically indicated.  Eligible for discharge in a.m. if clinically stable.  Assessment:    1. Coronary artery disease status post PCI to the LAD (10/2017) 2. Hypertension 3. Dyslipidemia  Plan:   Mr. Helfman has had some improvement in his leg pains and fatigue after decreasing his atorvastatin however he remains at a goal LDL less than 70.  Blood pressures well controlled.  He denies any further anginal symptoms.  Plan follow-up with me annually or sooner as necessary.  Yetta Barre, MD, [facility], FACP  [place]  [facility]  Medical Director of the Advanced Lipid Disorders &  Cardiovascular Risk Reduction Clinic Diplomate of the American Board of Clinical Lipidology Attending Cardiologist  Direct Dial: [phone]  Fax: [phone]  Website:  [email or web address]  [phone], MD  09/20/2020 4:35 PM

## 2020-11-15 DIAGNOSIS — Z0000 Encounter for general adult medical examination without abnormal findings: Secondary | ICD-10-CM | POA: Diagnosis not present

## 2020-11-15 DIAGNOSIS — I2510 Atherosclerotic heart disease of native coronary artery without angina pectoris: Secondary | ICD-10-CM | POA: Diagnosis not present

## 2020-11-15 DIAGNOSIS — Z125 Encounter for screening for malignant neoplasm of prostate: Secondary | ICD-10-CM | POA: Diagnosis not present

## 2020-11-15 DIAGNOSIS — Z23 Encounter for immunization: Secondary | ICD-10-CM | POA: Diagnosis not present

## 2020-11-15 DIAGNOSIS — E785 Hyperlipidemia, unspecified: Secondary | ICD-10-CM | POA: Diagnosis not present

## 2021-05-14 ENCOUNTER — Other Ambulatory Visit: Payer: Self-pay | Admitting: Internal Medicine

## 2021-08-08 ENCOUNTER — Other Ambulatory Visit: Payer: Self-pay | Admitting: Internal Medicine

## 2021-11-18 DIAGNOSIS — Z125 Encounter for screening for malignant neoplasm of prostate: Secondary | ICD-10-CM | POA: Diagnosis not present

## 2021-11-18 DIAGNOSIS — I2510 Atherosclerotic heart disease of native coronary artery without angina pectoris: Secondary | ICD-10-CM | POA: Diagnosis not present

## 2021-11-18 DIAGNOSIS — Z0000 Encounter for general adult medical examination without abnormal findings: Secondary | ICD-10-CM | POA: Diagnosis not present

## 2021-11-18 DIAGNOSIS — E785 Hyperlipidemia, unspecified: Secondary | ICD-10-CM | POA: Diagnosis not present

## 2021-12-16 ENCOUNTER — Other Ambulatory Visit: Payer: Self-pay

## 2021-12-16 ENCOUNTER — Encounter (HOSPITAL_BASED_OUTPATIENT_CLINIC_OR_DEPARTMENT_OTHER): Payer: Self-pay | Admitting: Internal Medicine

## 2021-12-16 ENCOUNTER — Ambulatory Visit (INDEPENDENT_AMBULATORY_CARE_PROVIDER_SITE_OTHER): Payer: BC Managed Care – PPO | Admitting: Internal Medicine

## 2021-12-16 VITALS — BP 125/64 | HR 64 | Ht 66.0 in | Wt 167.0 lb

## 2021-12-16 DIAGNOSIS — I10 Essential (primary) hypertension: Secondary | ICD-10-CM | POA: Diagnosis not present

## 2021-12-16 DIAGNOSIS — E785 Hyperlipidemia, unspecified: Secondary | ICD-10-CM

## 2021-12-16 DIAGNOSIS — I2510 Atherosclerotic heart disease of native coronary artery without angina pectoris: Secondary | ICD-10-CM | POA: Diagnosis not present

## 2021-12-16 NOTE — Progress Notes (Signed)
Cardiology Office Note    Date:  12/16/2021   ID:  Javier Rowland, DOB September 10, 1961, MRN [id number]  PCP:  Farris HasMorrow, Aaron, MD  Cardiologist:  Dr. Rennis GoldenHilty  CC: No complaints  History of Present Illness:    Javier Rowland is a 61 y.o. male with past medical history of HTN and HLD who presents to the office today for hospital follow-up.   He was recently admitted to [facility] from 12/3 - 11/03/2017 for evaluation of chest pain. He reported having episodes of intermittent substernal chest discomfort for the past several weeks lasting for 5-10 minutes at a time. His initial troponin value was found to be elevated at 0.50 and he was admitted for further evaluation of his NSTEMI. He underwent a cardiac catheterization on 11/02/2017 which showed 99% stenosis along the mid LAD. This was treated with placement of two overlapping Synergy DES. He was started on ASA and Brilinta and will be on these for at least 12 months.Lopressor and statin therapy were also added to his medication regimen.    In talking with the patient today, he reports overall doing well since his recent hospitalization. He denies any recurrent chest pain, dyspnea on exertion, orthopnea, PND, lower extremity edema, or palpitations. He has been walking for up to 30 minutes as a time and denies any anginal symptoms with this.   He reports good compliance with his medications and denies missing any recent doses of ASA or Brilinta. He has been following BP closely and it has remained well-controlled by review of his BP diary. BP is well-controlled at 124/60 during today's visit.   02/11/2018  Javier Rowland returns today for follow-up.  Over the past several months he is done well.  He says he has had some intermittent chest discomfort.  It is nothing like the pain he had before.  He took nitro with no significant relief.  He is not sure exactly when to take it.  The pressures been well controlled at 128/72.  EKG shows sinus rhythm today without any Q  waves.  He denies any significant bleeding problems on aspirin and Brilinta.  08/15/2018  Javier Rowland is seen today in follow-up.  He continues to do well.  He is closing in on a year post stent which is December 2019.  6 months ago he had a lipid profile which showed total cholesterol 111, triglycerides 105, HDL 42 and LDL 48.  He denies any chest pain or worsening shortness of breath.  His goal LDL is less than 70.  Blood pressure is well controlled.  08/31/2019  Javier Rowland returns today for follow-up.  He continues to do well without any chest pain or worsening shortness of breath.  Blood pressure appears reasonably well controlled today and he says it typically runs in the 120-130 systolic range at home.  He has come off of dual antiplatelet therapy but remains on aspirin.  His last lipid profile was a year ago and he will be due for this again.  He is also on metoprolol.  He denies any need for nitroglycerin.  09/20/2020  Javier Rowland is seen today in follow-up.  I saw him this past summer via a telemedicine visit and was having some fatigue and leg weakness.  I felt that his cholesterol had actually been very well treated in fact may be lower than he needed to.  I recommended decreasing his atorvastatin from 80 to 40 mg daily.  We just repeated his lipids which now show a  total cholesterol 115, HDL 43, LDL 55 and triglycerides 85.  He does note some mild improvement in his leg symptoms.  12/16/2021  Javier Rowland returns today for follow-up.  Overall he continues to do well.  He denies any chest pain or worsening shortness of breath.  He continues to work and is physically active.  EKG shows a normal sinus rhythm.  Blood pressure is well controlled today.  He denies any chest pain symptoms.  He does carry around nitroglycerin which he has not needed to use.  Recent lipids were reassessed in December showing total cholesterol 117, HDL 43, triglycerides 88 and LDL 57   Past Medical History:  Diagnosis  Date   CAD (coronary artery disease)    a. 10/2017: s/p NSTEMI with 2 overlapping DES to mid-LAD.    HTN (hypertension) 11/01/2017   Hypercholesterolemia    Hyperlipidemia     Past Surgical History:  Procedure Laterality Date   CORONARY STENT INTERVENTION N/A 11/02/2017   Procedure: CORONARY STENT INTERVENTION;  Surgeon: Lyn Records, MD;  Location: [facility] INVASIVE CV LAB;  Service: Cardiovascular;  Laterality: N/A;   LEFT HEART CATH AND CORONARY ANGIOGRAPHY N/A 11/02/2017   Procedure: LEFT HEART CATH AND CORONARY ANGIOGRAPHY;  Surgeon: Lyn Records, MD;  Location: [facility] INVASIVE CV LAB;  Service: Cardiovascular;  Laterality: N/A;    Current Medications: Outpatient Medications Prior to Visit  Medication Sig Dispense Refill   ASPIRIN LOW DOSE 81 MG EC tablet TAKE 1 TABLET BY MOUTH EVERY DAY 90 tablet 3   atorvastatin (LIPITOR) 40 MG tablet TAKE 1 TABLET BY MOUTH EVERYDAY AT 6 PM 90 tablet 3   metoprolol tartrate (LOPRESSOR) 25 MG tablet TAKE 0.5 TABLETS (12.5 MG TOTAL) BY MOUTH 2 (TWO) TIMES DAILY. 90 tablet 3   nitroGLYCERIN (NITROSTAT) 0.4 MG SL tablet PLACE 1 TABLET UNDER THE TONGUE EVERY 5 MINUTES FOR 3 DOSES AS NEEDED FOR CHEST PAIN 25 tablet 2   No facility-administered medications prior to visit.     Allergies:   Patient has no known allergies.   Social History   Socioeconomic History   Marital status: Married    Spouse name: Not on file   Number of children: Not on file   Years of education: Not on file   Highest education level: Not on file  Occupational History   Not on file  Tobacco Use   Smoking status: Never   Smokeless tobacco: Never  Substance and Sexual Activity   Alcohol use: No    Alcohol/week: 0.0 standard drinks   Drug use: No   Sexual activity: Not on file  Other Topics Concern   Not on file  Social History Narrative   Not on file   Social Determinants of Health   Financial Resource Strain: Not on file  Food Insecurity: Not on file  Transportation  Needs: Not on file  Physical Activity: Not on file  Stress: Not on file  Social Connections: Not on file     Family History:  The patient's family history includes Allergies in his daughter; Colon cancer in his father; Heart disease in his maternal grandfather.   Review of Systems:   Pertinent items noted in HPI and remainder of comprehensive ROS otherwise negative.   Physical Exam:    VS:  BP 125/64    Pulse 64    Ht 5\' 6"  (1.676 m)    Wt 167 lb (75.8 kg)    SpO2 97%    BMI 26.95 kg/m  General appearance: alert and no distress Neck: no carotid bruit, no JVD and thyroid not enlarged, symmetric, no tenderness/mass/nodules Lungs: clear to auscultation bilaterally Heart: regular rate and rhythm, S1, S2 normal, no murmur, click, rub or gallop Abdomen: soft, non-tender; bowel sounds normal; no masses,  no organomegaly Extremities: extremities normal, atraumatic, no cyanosis or edema Pulses: 2+ and symmetric Skin: Skin color, texture, turgor normal. No rashes or lesions Neurologic: Grossly normal Psych: Pleasant   Wt Readings from Last 3 Encounters:  12/16/21 167 lb (75.8 kg)  09/20/20 174 lb (78.9 kg)  08/31/19 170 lb (77.1 kg)     Studies/Labs Reviewed:   EKG:   Normal sinus rhythm at 64-personally reviewed  Recent Labs: No results found for requested labs within last 8760 hours.   Lipid Panel    Component Value Date/Time   CHOL 115 09/10/2020 0809   TRIG 85 09/10/2020 0809   HDL 43 09/10/2020 0809   CHOLHDL 2.7 09/10/2020 0809   CHOLHDL 3.1 11/02/2017 0508   VLDL 19 11/02/2017 0508   LDLCALC 55 09/10/2020 0809    Additional studies/ records that were reviewed today include:   Cardiac Catheterization: 11/02/2017 Severe eccentric 99% mid LAD stenosis beyond which there is TIMI grade II flow.  The most severe lesion lies within a segment of 40% narrowing in the mid vessel.  The LAD is otherwise widely patent Widely patent left main. 50% segmental stenosis in the  second obtuse marginal. Normal widely patent nondominant right coronary. Successful PCI of the mid LAD using overlapping synergy 16 x 3.5 and 8 x 3.5 drug-eluting stents postdilated to 3.75 mm in diameter.  99% stenosis reduced to 0% with TIMI grade III flow.   RECOMMENDATIONS:   Aspirin and Brilinta for at least 12 months. Aggressive risk factor modification: LDL less than 70, screening for diabetes, blood pressure 130/85 mmHg or less, and others as clinically indicated. Eligible for discharge in a.m. if clinically stable.  Assessment:    Coronary artery disease status post PCI to the LAD (10/2017) Hypertension Dyslipidemia  Plan:   Javier Rowland continues to do well without worsening chest pain or shortness of breath.  Blood pressure is well controlled.  Cholesterol is at goal.  EKG is normal.  He denies any anginal symptoms.  He is physically active.  We will continue his current therapies and plan follow-up annually or sooner as necessary  Chrystie Nose, MD, [facility], FACP  [place]   [facility]  Medical Director of the Advanced Lipid Disorders &  Cardiovascular Risk Reduction Clinic Diplomate of the American Board of Clinical Lipidology Attending Cardiologist  Direct Dial: [phone]   Fax: [phone]  Website:  [email or web address]  Chrystie Nose, MD  12/16/2021 12:58 PM

## 2021-12-16 NOTE — Patient Instructions (Signed)

## 2021-12-23 ENCOUNTER — Other Ambulatory Visit (HOSPITAL_BASED_OUTPATIENT_CLINIC_OR_DEPARTMENT_OTHER): Payer: Self-pay | Admitting: Internal Medicine

## 2022-02-20 ENCOUNTER — Ambulatory Visit: Payer: BC Managed Care – PPO | Admitting: Internal Medicine

## 2022-05-18 ENCOUNTER — Emergency Department (HOSPITAL_BASED_OUTPATIENT_CLINIC_OR_DEPARTMENT_OTHER): Payer: BC Managed Care – PPO | Admitting: Radiology

## 2022-05-18 ENCOUNTER — Encounter (HOSPITAL_BASED_OUTPATIENT_CLINIC_OR_DEPARTMENT_OTHER): Payer: Self-pay

## 2022-05-18 ENCOUNTER — Emergency Department (HOSPITAL_BASED_OUTPATIENT_CLINIC_OR_DEPARTMENT_OTHER)
Admission: EM | Admit: 2022-05-18 | Discharge: 2022-05-18 | Disposition: A | Discharge status: Home / Self Care | Payer: BC Managed Care – PPO | Attending: Emergency Medicine | Admitting: Emergency Medicine

## 2022-05-18 ENCOUNTER — Other Ambulatory Visit: Payer: Self-pay

## 2022-05-18 DIAGNOSIS — I2510 Atherosclerotic heart disease of native coronary artery without angina pectoris: Secondary | ICD-10-CM | POA: Diagnosis not present

## 2022-05-18 DIAGNOSIS — M25561 Pain in right knee: Secondary | ICD-10-CM | POA: Diagnosis not present

## 2022-05-18 DIAGNOSIS — Z7982 Long term (current) use of aspirin: Secondary | ICD-10-CM | POA: Insufficient documentation

## 2022-05-18 DIAGNOSIS — I10 Essential (primary) hypertension: Secondary | ICD-10-CM | POA: Diagnosis not present

## 2022-05-18 DIAGNOSIS — Z79899 Other long term (current) drug therapy: Secondary | ICD-10-CM | POA: Insufficient documentation

## 2022-05-18 NOTE — Discharge Instructions (Signed)
Wear the knee immobilizer at all times can remove to shower.  Contact [facility] for follow-up tomorrow morning they will most likely get you in pretty quickly for evaluation.  Work note provided.  X-rays from a bony standpoint without any acute findings.

## 2022-05-18 NOTE — ED Provider Notes (Signed)
[facility] EMERGENCY DEPT Provider Note   CSN: [id number] Arrival date & time: 05/18/22  1932     History  Chief Complaint  Patient presents with   Knee Pain    Javier Rowland is a 61 y.o. male.  Patient this evening was getting up on a chair to reach something on the wall and his knee popped this was around 1900.  Patient's had some pain with the knee since but is able to flex it and extend it.  But it is uncomfortable.  Never had any prior injuries no other injuries no problem with the knee been in the past.  Past medical history significant for hyperlipidemia high cholesterol hypertension coronary artery disease with stents.  In 2018.  Patient does not have a current orthopedic surgeon.  Past surgical history noncontributory patient never smoked.  Patient works in Production designer, theatre/television/film.       Home Medications Prior to Admission medications   Medication Sig Start Date End Date Taking? Authorizing Provider  ASPIRIN LOW DOSE 81 MG EC tablet TAKE 1 TABLET BY MOUTH EVERY DAY 08/06/20   Hilty, Lisette Abu, MD  atorvastatin (LIPITOR) 40 MG tablet TAKE 1 TABLET BY MOUTH EVERYDAY AT 6 PM 05/15/21   Hilty, Lisette Abu, MD  metoprolol tartrate (LOPRESSOR) 25 MG tablet TAKE 0.5 TABLETS (12.5 MG TOTAL) BY MOUTH 2 (TWO) TIMES DAILY. 08/08/21   Hilty, Lisette Abu, MD  nitroGLYCERIN (NITROSTAT) 0.4 MG SL tablet PLACE 1 TABLET UNDER THE TONGUE EVERY 5 MINUTES FOR 3 DOSES AS NEEDED FOR CHEST PAIN 12/23/21   Hilty, Lisette Abu, MD      Allergies    Patient has no known allergies.    Review of Systems   Review of Systems  Constitutional:  Negative for chills and fever.  HENT:  Negative for ear pain and sore throat.   Eyes:  Negative for pain and visual disturbance.  Respiratory:  Negative for cough and shortness of breath.   Cardiovascular:  Negative for chest pain and palpitations.  Gastrointestinal:  Negative for abdominal pain and vomiting.  Genitourinary:  Negative for dysuria and hematuria.   Musculoskeletal:  Positive for joint swelling. Negative for arthralgias and back pain.  Skin:  Negative for color change and rash.  Neurological:  Negative for seizures and syncope.  All other systems reviewed and are negative.   Physical Exam Updated Vital Signs BP (!) 144/93 (BP Location: Right Arm)   Pulse 86   Temp 97.9 F (36.6 C)   Resp 18   Ht 1.676 m (5\' 6" )   Wt 75.8 kg   SpO2 100%   BMI 26.97 kg/m  Physical Exam Vitals and nursing note reviewed.  Constitutional:      General: He is not in acute distress.    Appearance: Normal appearance. He is well-developed. He is not ill-appearing.  HENT:     Head: Normocephalic and atraumatic.  Eyes:     Conjunctiva/sclera: Conjunctivae normal.  Cardiovascular:     Rate and Rhythm: Normal rate and regular rhythm.     Heart sounds: No murmur heard. Pulmonary:     Effort: Pulmonary effort is normal. No respiratory distress.     Breath sounds: Normal breath sounds.  Abdominal:     Palpations: Abdomen is soft.     Tenderness: There is no abdominal tenderness.  Musculoskeletal:        General: Swelling and tenderness present. No deformity.     Cervical back: Neck supple.     Right lower  leg: No edema.     Left lower leg: No edema.     Comments: Localized swelling to the right knee more inferior lateral.  Some tenderness to palpation over the medial meniscus area.  Patella is not dislocated.  No effusion.  Distally neurovascularly intact.  No calf swelling or tenderness.  Skin:    General: Skin is warm and dry.     Capillary Refill: Capillary refill takes less than 2 seconds.  Neurological:     General: No focal deficit present.     Mental Status: He is alert and oriented to person, place, and time.     Cranial Nerves: No cranial nerve deficit.     Sensory: No sensory deficit.     Motor: No weakness.  Psychiatric:        Mood and Affect: Mood normal.     ED Results / Procedures / Treatments   Labs (all labs ordered  are listed, but only abnormal results are displayed) Labs Reviewed - No data to display  EKG None  Radiology DG Knee Complete 4 Views Right  Result Date: 05/18/2022 CLINICAL DATA:  Injury pain EXAM: RIGHT KNEE - COMPLETE 4+ VIEW COMPARISON:  None Available. FINDINGS: No evidence of fracture, dislocation, or joint effusion. No evidence of arthropathy or other focal bone abnormality. Soft tissues are unremarkable. IMPRESSION: Negative. Electronically Signed   By: Jasmine Pang M.D.   On: 05/18/2022 20:00    Procedures Procedures    Medications Ordered in ED Medications - No data to display  ED Course/ Medical Decision Making/ A&P                           Medical Decision Making Amount and/or Complexity of Data Reviewed Radiology: ordered.  Knee immobilizer at all times can be removed for showering.  Make an appointment follow-up with [facility].  Work note provided.  X-ray without any acute bony abnormalities.  Knee exam shows a little bit of swelling around the right knee laterally and inferiorly.  And some tenderness to palpation over the right medial meniscus area.  No effusion.  Neurovascular intact distally.  No other injuries.  No prior history of any knee problem.  Final Clinical Impression(s) / ED Diagnoses Final diagnoses:  Acute pain of right knee    Rx / DC Orders ED Discharge Orders     None         Vanetta Mulders, MD 05/18/22 2333

## 2022-05-18 NOTE — ED Triage Notes (Signed)
Patient here POV from Home.  Endorses a "Popping" Sensation to Right Knee approximately 30 minutes PTA when he was stepping into a Chair.  No Fall or other Injuries.   NAD Noted during Triage. A&Ox4. GCS 15. BIB Wheelchair.

## 2022-05-19 DIAGNOSIS — M25561 Pain in right knee: Secondary | ICD-10-CM | POA: Diagnosis not present

## 2022-06-04 DIAGNOSIS — M25561 Pain in right knee: Secondary | ICD-10-CM | POA: Diagnosis not present

## 2022-07-17 ENCOUNTER — Other Ambulatory Visit: Payer: Self-pay | Admitting: Internal Medicine

## 2022-08-03 ENCOUNTER — Other Ambulatory Visit: Payer: Self-pay | Admitting: Internal Medicine

## 2022-09-25 DIAGNOSIS — H40013 Open angle with borderline findings, low risk, bilateral: Secondary | ICD-10-CM | POA: Diagnosis not present

## 2022-11-11 DIAGNOSIS — I2510 Atherosclerotic heart disease of native coronary artery without angina pectoris: Secondary | ICD-10-CM | POA: Diagnosis not present

## 2022-11-11 DIAGNOSIS — E785 Hyperlipidemia, unspecified: Secondary | ICD-10-CM | POA: Diagnosis not present

## 2022-11-11 DIAGNOSIS — Z125 Encounter for screening for malignant neoplasm of prostate: Secondary | ICD-10-CM | POA: Diagnosis not present

## 2022-11-19 DIAGNOSIS — E785 Hyperlipidemia, unspecified: Secondary | ICD-10-CM | POA: Diagnosis not present

## 2022-11-19 DIAGNOSIS — Z0000 Encounter for general adult medical examination without abnormal findings: Secondary | ICD-10-CM | POA: Diagnosis not present

## 2022-11-19 DIAGNOSIS — Z125 Encounter for screening for malignant neoplasm of prostate: Secondary | ICD-10-CM | POA: Diagnosis not present

## 2022-11-19 DIAGNOSIS — I2510 Atherosclerotic heart disease of native coronary artery without angina pectoris: Secondary | ICD-10-CM | POA: Diagnosis not present

## 2022-12-16 ENCOUNTER — Encounter: Payer: Self-pay | Admitting: Internal Medicine

## 2022-12-16 ENCOUNTER — Ambulatory Visit: Payer: BC Managed Care – PPO | Attending: Internal Medicine | Admitting: Internal Medicine

## 2022-12-16 VITALS — BP 140/86 | HR 57 | Ht 66.0 in | Wt 170.0 lb

## 2022-12-16 DIAGNOSIS — E785 Hyperlipidemia, unspecified: Secondary | ICD-10-CM | POA: Diagnosis not present

## 2022-12-16 DIAGNOSIS — I2510 Atherosclerotic heart disease of native coronary artery without angina pectoris: Secondary | ICD-10-CM

## 2022-12-16 DIAGNOSIS — I10 Essential (primary) hypertension: Secondary | ICD-10-CM

## 2022-12-16 MED ORDER — METOPROLOL TARTRATE 25 MG PO TABS: ORAL_TABLET | ORAL | 3 refills | Status: DC

## 2022-12-16 MED ORDER — ATORVASTATIN CALCIUM 40 MG PO TABS: ORAL_TABLET | ORAL | 3 refills | Status: DC

## 2022-12-16 MED ORDER — NITROGLYCERIN 0.4 MG SL SUBL: 0.4000 mg | SUBLINGUAL_TABLET | SUBLINGUAL | 3 refills | Status: AC | PRN

## 2022-12-16 NOTE — Patient Instructions (Signed)
Medication Instructions:  Your physician recommends that you continue on your current medications as directed. Please refer to the Current Medication list given to you today.  *If you need a refill on your cardiac medications before your next appointment, please call your pharmacy*   Follow-Up: At [facility], you and your health needs are our priority.  As part of our continuing mission to provide you with exceptional heart care, we have created designated Provider Care Teams.  These Care Teams include your primary Cardiologist (physician) and Advanced Practice Providers (APPs -  Physician Assistants and Nurse Practitioners) who all work together to provide you with the care you need, when you need it.  We recommend signing up for the patient portal called "MyChart".  Sign up information is provided on this After Visit Summary.  MyChart is used to connect with patients for Virtual Visits (Telemedicine).  Patients are able to view lab/test results, encounter notes, upcoming appointments, etc.  Non-urgent messages can be sent to your provider as well.   To learn more about what you can do with MyChart, go to [email or web address].    Your next appointment:   12 month(s)  Provider:   Kenneth C Hilty, MD   

## 2022-12-16 NOTE — Progress Notes (Signed)
Cardiology Office Note    Date:  12/16/2022   ID:  Javier Rowland, DOB 22-Oct-1961, MRN [id number]  PCP:  London Pepper, MD  Cardiologist:  Dr. Debara Pickett  CC: No complaints  History of Present Illness:    Javier Rowland is a 62 y.o. male with past medical history of HTN and HLD who presents to the office today for hospital follow-up.   He was recently admitted to [facility] from 12/3 - 11/03/2017 for evaluation of chest pain. He reported having episodes of intermittent substernal chest discomfort for the past several weeks lasting for 5-10 minutes at a time. His initial troponin value was found to be elevated at 0.50 and he was admitted for further evaluation of his NSTEMI. He underwent a cardiac catheterization on 11/02/2017 which showed 99% stenosis along the mid LAD. This was treated with placement of two overlapping Synergy DES. He was started on ASA and Brilinta and will be on these for at least 12 months.Lopressor and statin therapy were also added to his medication regimen.    In talking with the patient today, he reports overall doing well since his recent hospitalization. He denies any recurrent chest pain, dyspnea on exertion, orthopnea, PND, lower extremity edema, or palpitations. He has been walking for up to 30 minutes as a time and denies any anginal symptoms with this.   He reports good compliance with his medications and denies missing any recent doses of ASA or Brilinta. He has been following BP closely and it has remained well-controlled by review of his BP diary. BP is well-controlled at 124/60 during today's visit.   02/11/2018  Javier Rowland returns today for follow-up.  Over the past several months he is done well.  He says he has had some intermittent chest discomfort.  It is nothing like the pain he had before.  He took nitro with no significant relief.  He is not sure exactly when to take it.  The pressures been well controlled at 128/72.  EKG shows sinus rhythm today without any  Q waves.  He denies any significant bleeding problems on aspirin and Brilinta.  08/15/2018  Javier Rowland is seen today in follow-up.  He continues to do well.  He is closing in on a year post stent which is December 2019.  6 months ago he had a lipid profile which showed total cholesterol 111, triglycerides 105, HDL 42 and LDL 48.  He denies any chest pain or worsening shortness of breath.  His goal LDL is less than 70.  Blood pressure is well controlled.  08/31/2019  Javier Rowland returns today for follow-up.  He continues to do well without any chest pain or worsening shortness of breath.  Blood pressure appears reasonably well controlled today and he says it typically runs in the 378-588 systolic range at home.  He has come off of dual antiplatelet therapy but remains on aspirin.  His last lipid profile was a year ago and he will be due for this again.  He is also on metoprolol.  He denies any need for nitroglycerin.  09/20/2020  Javier Rowland is seen today in follow-up.  I saw him this past summer via a telemedicine visit and was having some fatigue and leg weakness.  I felt that his cholesterol had actually been very well treated in fact may be lower than he needed to.  I recommended decreasing his atorvastatin from 80 to 40 mg daily.  We just repeated his lipids which now  show a total cholesterol 115, HDL 43, LDL 55 and triglycerides 85.  He does note some mild improvement in his leg symptoms.  12/16/2021  Javier Rowland returns today for follow-up.  Overall he continues to do well.  He denies any chest pain or worsening shortness of breath.  He continues to work and is physically active.  EKG shows a normal sinus rhythm.  Blood pressure is well controlled today.  He denies any chest pain symptoms.  He does carry around nitroglycerin which he has not needed to use.  Recent lipids were reassessed in December showing total cholesterol 117, HDL 43, triglycerides 88 and LDL 57  12/16/2022  Javier Rowland is seen today  in follow-up.  He reports he continues to do well without chest pain or shortness of breath.  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  Blood pressure is a little elevated today however recently at his PCP in December his blood pressure is 123/75.  EKG is normal.  His last lipid profile showed an LDL of 51 in December.   Past Medical History:  Diagnosis Date   CAD (coronary artery disease)    a. 10/2017: s/p NSTEMI with 2 overlapping DES to mid-LAD.    HTN (hypertension) 11/01/2017   Hypercholesterolemia    Hyperlipidemia     Past Surgical History:  Procedure Laterality Date   CORONARY STENT INTERVENTION N/A 11/02/2017   Procedure: CORONARY STENT INTERVENTION;  Surgeon: Belva Crome, MD;  Location: [facility];  Service: Cardiovascular;  Laterality: N/A;   LEFT HEART CATH AND CORONARY ANGIOGRAPHY N/A 11/02/2017   Procedure: LEFT HEART CATH AND CORONARY ANGIOGRAPHY;  Surgeon: Belva Crome, MD;  Location: [facility];  Service: Cardiovascular;  Laterality: N/A;    Current Medications: Outpatient Medications Prior to Visit  Medication Sig Dispense Refill   ASPIRIN LOW DOSE 81 MG EC tablet TAKE 1 TABLET BY MOUTH EVERY DAY 90 tablet 3   atorvastatin (LIPITOR) 40 MG tablet TAKE 1 TABLET BY MOUTH EVERYDAY AT 6 PM 90 tablet 01   metoprolol tartrate (LOPRESSOR) 25 MG tablet TAKE 0.5 TABLETS BY MOUTH 2 TIMES DAILY. 90 tablet 2   nitroGLYCERIN (NITROSTAT) 0.4 MG SL tablet PLACE 1 TABLET UNDER THE TONGUE EVERY 5 MINUTES FOR 3 DOSES AS NEEDED FOR CHEST PAIN 25 tablet 2   No facility-administered medications prior to visit.     Allergies:   Patient has no known allergies.   Social History   Socioeconomic History   Marital status: Married    Spouse name: Not on file   Number of children: Not on file   Years of education: Not on file   Highest education level: Not on file  Occupational History   Not on file  Tobacco Use   Smoking status: Never   Smokeless  tobacco: Never  Substance and Sexual Activity   Alcohol use: No    Alcohol/week: 0.0 standard drinks of alcohol   Drug use: No   Sexual activity: Not on file  Other Topics Concern   Not on file  Social History Narrative   Not on file   Social Determinants of Health   Financial Resource Strain: Not on file  Food Insecurity: Not on file  Transportation Needs: Not on file  Physical Activity: Not on file  Stress: Not on file  Social Connections: Not on file     Family History:  The patient's family history includes Allergies in his daughter; Colon cancer in his  father; Heart disease in his maternal grandfather.   Review of Systems:   Pertinent items noted in HPI and remainder of comprehensive ROS otherwise negative.   Physical Exam:    VS:  BP (!) 140/86   Pulse (!) 57   Ht 5\' 6"  (1.676 m)   Wt 170 lb (77.1 kg)   SpO2 99%   BMI 27.44 kg/m    General appearance: alert and no distress Neck: no carotid bruit, no JVD and thyroid not enlarged, symmetric, no tenderness/mass/nodules Lungs: clear to auscultation bilaterally Heart: regular rate and rhythm, S1, S2 normal, no murmur, click, rub or gallop Abdomen: soft, non-tender; bowel sounds normal; no masses,  no organomegaly Extremities: extremities normal, atraumatic, no cyanosis or edema Pulses: 2+ and symmetric Skin: Skin color, texture, turgor normal. No rashes or lesions Neurologic: Grossly normal Psych: Pleasant   Wt Readings from Last 3 Encounters:  12/16/22 170 lb (77.1 kg)  05/18/22 167 lb 1.7 oz (75.8 kg)  12/16/21 167 lb (75.8 kg)     Studies/Labs Reviewed:   EKG:   Normal sinus rhythm at 63-personally reviewed  Recent Labs: No results found for requested labs within last 365 days.   Lipid Panel    Component Value Date/Time   CHOL 115 09/10/2020 0809   TRIG 85 09/10/2020 0809   HDL 43 09/10/2020 0809   CHOLHDL 2.7 09/10/2020 0809   CHOLHDL 3.1 11/02/2017 0508   VLDL 19 11/02/2017 0508   LDLCALC  55 09/10/2020 0809    Additional studies/ records that were reviewed today include:   Cardiac Catheterization: 11/02/2017 Severe eccentric 99% mid LAD stenosis beyond which there is TIMI grade II flow.  The most severe lesion lies within a segment of 40% narrowing in the mid vessel.  The LAD is otherwise widely patent Widely patent left main. 50% segmental stenosis in the second obtuse marginal. Normal widely patent nondominant right coronary. Successful PCI of the mid LAD using overlapping synergy 16 x 3.5 and 8 x 3.5 drug-eluting stents postdilated to 3.75 mm in diameter.  99% stenosis reduced to 0% with TIMI grade III flow.   RECOMMENDATIONS:   Aspirin and Brilinta for at least 12 months. Aggressive risk factor modification: LDL less than 70, screening for diabetes, blood pressure 130/85 mmHg or less, and others as clinically indicated. Eligible for discharge in a.m. if clinically stable.  Assessment:    Coronary artery disease status post PCI to the LAD (10/2017) Hypertension Dyslipidemia  Plan:   Javier Rowland denies any worsening shortness of breath or chest pain.  His cholesterol is well-controlled with LDL less than 70.  Blood pressure is a little elevated today but has been normal at home.  Will continue current therapies.  Plan follow-up with me annually or sooner as necessary.  Yetta Barre, MD, [facility], FACP  [place]  [facility]  Medical Director of the Advanced Lipid Disorders &  Cardiovascular Risk Reduction Clinic Diplomate of the American Board of Clinical Lipidology Attending Cardiologist  Direct Dial: [phone]  Fax: [phone]  Website:  [email or web address]  [phone], MD  12/16/2022 4:41 PM

## 2023-01-15 ENCOUNTER — Other Ambulatory Visit: Payer: Self-pay | Admitting: Internal Medicine

## 2023-04-01 DIAGNOSIS — H40013 Open angle with borderline findings, low risk, bilateral: Secondary | ICD-10-CM | POA: Diagnosis not present

## 2023-05-28 ENCOUNTER — Emergency Department (HOSPITAL_BASED_OUTPATIENT_CLINIC_OR_DEPARTMENT_OTHER)
Admission: EM | Admit: 2023-05-28 | Discharge: 2023-05-28 | Disposition: A | Discharge status: Home / Self Care | Payer: BC Managed Care – PPO | Attending: Emergency Medicine | Admitting: Emergency Medicine

## 2023-05-28 ENCOUNTER — Other Ambulatory Visit: Payer: Self-pay

## 2023-05-28 ENCOUNTER — Encounter (HOSPITAL_BASED_OUTPATIENT_CLINIC_OR_DEPARTMENT_OTHER): Payer: Self-pay | Admitting: Emergency Medicine

## 2023-05-28 ENCOUNTER — Emergency Department (HOSPITAL_BASED_OUTPATIENT_CLINIC_OR_DEPARTMENT_OTHER): Payer: BC Managed Care – PPO

## 2023-05-28 DIAGNOSIS — Y939 Activity, unspecified: Secondary | ICD-10-CM | POA: Insufficient documentation

## 2023-05-28 DIAGNOSIS — I2510 Atherosclerotic heart disease of native coronary artery without angina pectoris: Secondary | ICD-10-CM | POA: Diagnosis not present

## 2023-05-28 DIAGNOSIS — I10 Essential (primary) hypertension: Secondary | ICD-10-CM | POA: Diagnosis not present

## 2023-05-28 DIAGNOSIS — M7021 Olecranon bursitis, right elbow: Secondary | ICD-10-CM | POA: Diagnosis not present

## 2023-05-28 DIAGNOSIS — M7989 Other specified soft tissue disorders: Secondary | ICD-10-CM | POA: Diagnosis not present

## 2023-05-28 DIAGNOSIS — M25521 Pain in right elbow: Secondary | ICD-10-CM | POA: Diagnosis not present

## 2023-05-28 LAB — URINALYSIS, ROUTINE W REFLEX MICROSCOPIC
Bilirubin Urine: NEGATIVE
Glucose, UA: NEGATIVE mg/dL
Hgb urine dipstick: NEGATIVE
Ketones, ur: 15 mg/dL — AB
Leukocytes,Ua: NEGATIVE
Nitrite: NEGATIVE
Protein, ur: NEGATIVE mg/dL
Specific Gravity, Urine: <1.005 — ABNORMAL LOW (ref 1.005–1.030)
pH: 6.5 (ref 5.0–8.0)

## 2023-05-28 LAB — COMPREHENSIVE METABOLIC PANEL WITH GFR
ALT: 24 U/L (ref 0–44)
AST: 21 U/L (ref 15–41)
Albumin: 4.4 g/dL (ref 3.5–5.0)
Alkaline Phosphatase: 83 U/L (ref 38–126)
Anion gap: 11 (ref 5–15)
BUN: 12 mg/dL (ref 8–23)
CO2: 24 mmol/L (ref 22–32)
Calcium: 9.5 mg/dL (ref 8.9–10.3)
Chloride: 103 mmol/L (ref 98–111)
Creatinine, Ser: 0.92 mg/dL (ref 0.61–1.24)
GFR, Estimated: >60 mL/min (ref >60)
Glucose, Bld: 89 mg/dL (ref 70–99)
Potassium: 3.7 mmol/L (ref 3.5–5.1)
Sodium: 138 mmol/L (ref 135–145)
Total Bilirubin: 0.7 mg/dL (ref 0.3–1.2)
Total Protein: 7.8 g/dL (ref 6.5–8.1)

## 2023-05-28 LAB — CBC WITH DIFFERENTIAL/PLATELET
Abs Immature Granulocytes: 0.04 K/uL (ref 0.00–0.07)
Basophils Absolute: 0.1 K/uL (ref 0.0–0.1)
Basophils Relative: 1 %
Eosinophils Absolute: 0.1 K/uL (ref 0.0–0.5)
Eosinophils Relative: 1 %
HCT: 44.6 % (ref 39.0–52.0)
Hemoglobin: 15.6 g/dL (ref 13.0–17.0)
Immature Granulocytes: 0 %
Lymphocytes Relative: 20 %
Lymphs Abs: 2.9 K/uL (ref 0.7–4.0)
MCH: 31.5 pg (ref 26.0–34.0)
MCHC: 35 g/dL (ref 30.0–36.0)
MCV: 90.1 fL (ref 80.0–100.0)
Monocytes Absolute: 1.9 K/uL — ABNORMAL HIGH (ref 0.1–1.0)
Monocytes Relative: 13 %
Neutro Abs: 9.6 K/uL — ABNORMAL HIGH (ref 1.7–7.7)
Neutrophils Relative %: 65 %
Platelets: 210 K/uL (ref 150–400)
RBC: 4.95 MIL/uL (ref 4.22–5.81)
RDW: 12.9 % (ref 11.5–15.5)
WBC: 14.7 K/uL — ABNORMAL HIGH (ref 4.0–10.5)
nRBC: 0 % (ref 0.0–0.2)

## 2023-05-28 LAB — LACTIC ACID, PLASMA: Lactic Acid, Venous: 0.5 mmol/L (ref 0.5–1.9)

## 2023-05-28 MED ORDER — DOXYCYCLINE HYCLATE 100 MG PO TABS
100.0000 mg | ORAL_TABLET | Freq: Once | ORAL | Status: AC
  Administered 2023-05-28: 100 mg via ORAL
  Filled 2023-05-28: qty 1

## 2023-05-28 MED ORDER — DOXYCYCLINE HYCLATE 100 MG PO CAPS: 100.0000 mg | ORAL_CAPSULE | Freq: Two times a day (BID) | ORAL | 0 refills | Status: AC

## 2023-05-28 NOTE — ED Triage Notes (Signed)
Pt arrives pov, steady gait, c/o RT elbow redness, pain and swelling x 3 days. Denies injury

## 2023-05-28 NOTE — Discharge Instructions (Signed)
You were seen in the emergency room today with bursitis.  I am starting you on an antibiotic.  You should keep this area compressed either with an elbow sleeve or Ace wrap.  You may take anti-inflammatory medication such as ibuprofen for pain unless you have been told to not take this medicine.  I would like for you to follow with an orthopedic doctor and have listed the on-call team for me today.  You can call them on Monday for an appointment in the coming week.  Until you are seen by that doctor, if you develop streaking redness up your arm, fever (temp > 100.43F), or other sudden/severe symptoms please return to the emergency department for reevaluation.

## 2023-05-28 NOTE — ED Provider Notes (Signed)
Emergency Department Provider Note   I have reviewed the triage vital signs and the nursing notes.   HISTORY  Chief Complaint Joint Swelling   HPI Javier Rowland is a 62 y.o. male with PMH of CAD, HTN, and HLD presents emergency department valuation of right elbow pain and swelling.  Symptoms been present for the past day.  He does work and has had bursitis and various locations in the past.  He woke up in the morning with swelling to be right elbow.  He is able to move the elbow without pain.  He has not had fevers or chills.  He presents to the ED today because the area became erythematous. No drainage or bleeding. No streaking redness up the arm.   Past Medical History:  Diagnosis Date   CAD (coronary artery disease)    a. 10/2017: s/p NSTEMI with 2 overlapping DES to mid-LAD.    HTN (hypertension) 11/01/2017   Hypercholesterolemia    Hyperlipidemia     Review of Systems  Constitutional: No fever/chills Cardiovascular: Denies chest pain. Respiratory: Denies shortness of breath. Gastrointestinal: No abdominal pain.   Skin: Positive right elbow pain/erythema.   ____________________________________________   PHYSICAL EXAM:  VITAL SIGNS: ED Triage Vitals  Enc Vitals Group     BP 05/28/23 1708 (!) 145/94     Pulse Rate 05/28/23 1708 (!) 110     Resp 05/28/23 1708 20     Temp 05/28/23 1708 98.2 F (36.8 C)     Temp src --      SpO2 05/28/23 1708 97 %     Weight 05/28/23 1710 170 lb (77.1 kg)   Constitutional: Alert and oriented. Well appearing and in no acute distress. Eyes: Conjunctivae are normal.  Head: Atraumatic. Nose: No congestion/rhinnorhea. Mouth/Throat: Mucous membranes are moist.  Neck: No stridor.   Cardiovascular: Good peripheral circulation.  Respiratory: Normal respiratory effort.  Gastrointestinal: No distention.  Musculoskeletal: Brisk ROM of the right elbow without difficulty. Mild swelling and erythema over the olecranon bursa. Minimal  warmth. No surrounding induration.  Neurologic:  Normal speech and language.  Skin:  Skin is warm, dry and intact. No rash noted.   ____________________________________________   LABS (all labs ordered are listed, but only abnormal results are displayed)  Labs Reviewed  CBC WITH DIFFERENTIAL/PLATELET - Abnormal; Notable for the following components:      Result Value   WBC 14.7 (*)    Neutro Abs 9.6 (*)    Monocytes Absolute 1.9 (*)    All other components within normal limits  URINALYSIS, ROUTINE W REFLEX MICROSCOPIC - Abnormal; Notable for the following components:   Color, Urine COLORLESS (*)    Specific Gravity, Urine <1.005 (*)    Ketones, ur 15 (*)    All other components within normal limits  LACTIC ACID, PLASMA  COMPREHENSIVE METABOLIC PANEL  LACTIC ACID, PLASMA   ____________________________________________  RADIOLOGY  DG Elbow Complete Right  Result Date: 05/28/2023 CLINICAL DATA:  elbow pain c/o RT elbow redness, pain and swelling x 3 days. Denies injury. EXAM: RIGHT ELBOW - COMPLETE 3+ VIEW COMPARISON:  None Available. FINDINGS: No cortical erosion or destruction. There is no evidence of fracture, dislocation, or joint effusion. There is no evidence of arthropathy or other focal bone abnormality. Soft tissues are unremarkable. IMPRESSION: Negative. Electronically Signed   By: Tish Frederickson M.D.   On: 05/28/2023 18:42    ____________________________________________   PROCEDURES  Procedure(s) performed:   Procedures  None  ____________________________________________  INITIAL IMPRESSION / ASSESSMENT AND PLAN / ED COURSE  Pertinent labs & imaging results that were available during my care of the patient were reviewed by me and considered in my medical decision making (see chart for details).   This patient is Presenting for Evaluation of elbow pain, which does require a range of treatment options, and is a complaint that involves a moderate risk of  morbidity and mortality.  The Differential Diagnoses include inflammatory bursitis, infectious bursitis, osteomyelitis, septic arthritis, etc.   Clinical Laboratory Tests Ordered, included CBC with leukocytosis of 14.7. Lactic acid normal. No AKI.   Radiologic Tests Ordered, included elbow XR. I independently interpreted the images and agree with radiology interpretation.   Cardiac Monitor Tracing which shows NSR. Tachycardia on resolved since arrival.   Medical Decision Making: Summary:  Patient presents to the emergency department with elbow pain.  He has brisk full range of motion of the right elbow.  Clinically I do not see evidence of septic joint.  He does have a mildly inflamed bursa which is erythematous.  His CBC does have a leukocytosis but he has not had fever or other constitutional symptoms of infection. Will cover with Doxycycline and have patient follow with ortho in the coming week.   Reevaluation with update and discussion with patient. XR without bony abnormality. Plan for bursitis mgmt and ortho follow up. Discussed strict ED return precautions.   Patient's presentation is most consistent with acute, uncomplicated illness.   Disposition: discharge  ____________________________________________  FINAL CLINICAL IMPRESSION(S) / ED DIAGNOSES  Final diagnoses:  Olecranon bursitis of right elbow     NEW OUTPATIENT MEDICATIONS STARTED DURING THIS VISIT:  Discharge Medication List as of 05/28/2023  6:55 PM     START taking these medications   Details  doxycycline (VIBRAMYCIN) 100 MG capsule Take 1 capsule (100 mg total) by mouth 2 (two) times daily for 7 days., Starting Fri 05/28/2023, Until Fri 06/04/2023, Normal        Note:  This document was prepared using Dragon voice recognition software and may include unintentional dictation errors.  Alona Bene, MD, [facility]    Iness Pangilinan, Arlyss Repress, MD 05/28/23 [phone]

## 2023-06-02 DIAGNOSIS — M25521 Pain in right elbow: Secondary | ICD-10-CM | POA: Diagnosis not present

## 2023-06-09 DIAGNOSIS — M7021 Olecranon bursitis, right elbow: Secondary | ICD-10-CM | POA: Diagnosis not present

## 2023-07-22 DIAGNOSIS — M13832 Other specified arthritis, left wrist: Secondary | ICD-10-CM | POA: Diagnosis not present

## 2023-07-23 DIAGNOSIS — M25511 Pain in right shoulder: Secondary | ICD-10-CM | POA: Diagnosis not present

## 2023-09-06 DIAGNOSIS — I2510 Atherosclerotic heart disease of native coronary artery without angina pectoris: Secondary | ICD-10-CM | POA: Diagnosis not present

## 2023-09-06 DIAGNOSIS — R5383 Other fatigue: Secondary | ICD-10-CM | POA: Diagnosis not present

## 2023-10-15 DIAGNOSIS — H40013 Open angle with borderline findings, low risk, bilateral: Secondary | ICD-10-CM | POA: Diagnosis not present

## 2023-11-04 DIAGNOSIS — M25532 Pain in left wrist: Secondary | ICD-10-CM | POA: Diagnosis not present

## 2023-12-22 DIAGNOSIS — Z5181 Encounter for therapeutic drug level monitoring: Secondary | ICD-10-CM | POA: Diagnosis not present

## 2023-12-22 DIAGNOSIS — E291 Testicular hypofunction: Secondary | ICD-10-CM | POA: Diagnosis not present

## 2023-12-22 DIAGNOSIS — E785 Hyperlipidemia, unspecified: Secondary | ICD-10-CM | POA: Diagnosis not present

## 2023-12-22 DIAGNOSIS — E559 Vitamin D deficiency, unspecified: Secondary | ICD-10-CM | POA: Diagnosis not present

## 2023-12-22 DIAGNOSIS — Z125 Encounter for screening for malignant neoplasm of prostate: Secondary | ICD-10-CM | POA: Diagnosis not present

## 2023-12-22 DIAGNOSIS — Z0000 Encounter for general adult medical examination without abnormal findings: Secondary | ICD-10-CM | POA: Diagnosis not present

## 2023-12-31 ENCOUNTER — Encounter: Payer: Self-pay | Admitting: Internal Medicine

## 2023-12-31 ENCOUNTER — Ambulatory Visit: Payer: BC Managed Care – PPO | Attending: Internal Medicine | Admitting: Internal Medicine

## 2023-12-31 VITALS — BP 144/78 | HR 61 | Ht 66.0 in | Wt 170.0 lb

## 2023-12-31 DIAGNOSIS — I2510 Atherosclerotic heart disease of native coronary artery without angina pectoris: Secondary | ICD-10-CM

## 2023-12-31 DIAGNOSIS — R0602 Shortness of breath: Secondary | ICD-10-CM | POA: Diagnosis not present

## 2023-12-31 DIAGNOSIS — E785 Hyperlipidemia, unspecified: Secondary | ICD-10-CM

## 2023-12-31 DIAGNOSIS — I10 Essential (primary) hypertension: Secondary | ICD-10-CM | POA: Diagnosis not present

## 2023-12-31 NOTE — Patient Instructions (Addendum)
Medication Instructions:  Your physician recommends that you continue on your current medications as directed. Please refer to the Current Medication list given to you today.  *If you need a refill on your cardiac medications before your next appointment, please call your pharmacy*   Lab Work: NONE ordered at this time of appointment   Testing/Procedures: Your physician has requested that you have en exercise stress myoview. For further information please visit [email or web address] Please follow instruction sheet, as given.   Follow-Up: At [facility], you and your health needs are our priority.  As part of our continuing mission to provide you with exceptional heart care, we have created designated Provider Care Teams.  These Care Teams include your primary Cardiologist (physician) and Advanced Practice Providers (APPs -  Physician Assistants and Nurse Practitioners) who all work together to provide you with the care you need, when you need it.  We recommend signing up for the patient portal called "MyChart".  Sign up information is provided on this After Visit Summary.  MyChart is used to connect with patients for Virtual Visits (Telemedicine).  Patients are able to view lab/test results, encounter notes, upcoming appointments, etc.  Non-urgent messages can be sent to your provider as well.   To learn more about what you can do with MyChart, go to [email or web address].    Your next appointment:   1 year(s)  Provider:   Chrystie Nose, MD     Other Instructions How to Prepare for Your Myoview Test (stress test):  1.Please do not take these medications before your test:  (please note if this is an exercise test pt should take beta blocker prior per Dr. Rennis Golden) 2.Your remaining medications may be taken with water. 3.Nothing to eat or drink, except water, 4 hours prior to arrival time.  NO caffeine/decaffeinated products, or chocolate 12 hours prior to arrival. 4.Ladies,  please do not wear dresses.  Skirts or pants are approprate, please wear a short sleeve shirt. 5.NO perfume, cologne or lotion 6.Wear comfortable walking shoes.  NO HEELS! 7.Total time is 3 to 4 hours; you may want to bring reading material for the waiting time. 8.Please report to [facility] for your test  What to expect after you arrive:  Once you arrive and check in for your appointment an IV will be started in your arm.  Then the Technoligist will inject a small amount of radioactive tracer.  There will be a 1 hour waiting period after this injection.  A series of pictures will be taken of your heart following this waiting period.  You will be prepped for the stress portion of the test.  During the stress portion of your test you will either walk on a treadmill or receive a small, safe amount of radioactive tracer injected in your IV.  After the stress portion, there is a short rest period during which time your heart and blood pressure will be monitored.  After the short rest period the Technologist will begin your second set of pictures.  Your doctor will inform you of your test results within 7-10 business days.  In preparation for your appointment, medication and supplies will be purchased.  Appointment availability is limited, so if you need to cancel or reschedule please call the office at [phone] 24 hours in advance to avoid a cancellation fee of $100.0

## 2023-12-31 NOTE — Progress Notes (Signed)
Cardiology Office Note    Date:  12/31/2023   ID:  Tyrus Wilms, DOB 1960-12-08, MRN [id number]  PCP:  Farris Has, MD  Cardiologist:  Dr. Rennis Golden  CC: No complaints  History of Present Illness:    Kennen Stammer is a 63 y.o. male with past medical history of HTN and HLD who presents to the office today for hospital follow-up.   He was recently admitted to [facility] from 12/3 - 11/03/2017 for evaluation of chest pain. He reported having episodes of intermittent substernal chest discomfort for the past several weeks lasting for 5-10 minutes at a time. His initial troponin value was found to be elevated at 0.50 and he was admitted for further evaluation of his NSTEMI. He underwent a cardiac catheterization on 11/02/2017 which showed 99% stenosis along the mid LAD. This was treated with placement of two overlapping Synergy DES. He was started on ASA and Brilinta and will be on these for at least 12 months.Lopressor and statin therapy were also added to his medication regimen.    In talking with the patient today, he reports overall doing well since his recent hospitalization. He denies any recurrent chest pain, dyspnea on exertion, orthopnea, PND, lower extremity edema, or palpitations. He has been walking for up to 30 minutes as a time and denies any anginal symptoms with this.   He reports good compliance with his medications and denies missing any recent doses of ASA or Brilinta. He has been following BP closely and it has remained well-controlled by review of his BP diary. BP is well-controlled at 124/60 during today's visit.   02/11/2018  Mr. Teall returns today for follow-up.  Over the past several months he is done well.  He says he has had some intermittent chest discomfort.  It is nothing like the pain he had before.  He took nitro with no significant relief.  He is not sure exactly when to take it.  The pressures been well controlled at 128/72.  EKG shows sinus rhythm today without any  Q waves.  He denies any significant bleeding problems on aspirin and Brilinta.  08/15/2018  Mr. Martos is seen today in follow-up.  He continues to do well.  He is closing in on a year post stent which is December 2019.  6 months ago he had a lipid profile which showed total cholesterol 111, triglycerides 105, HDL 42 and LDL 48.  He denies any chest pain or worsening shortness of breath.  His goal LDL is less than 70.  Blood pressure is well controlled.  08/31/2019  Mr. Regina returns today for follow-up.  He continues to do well without any chest pain or worsening shortness of breath.  Blood pressure appears reasonably well controlled today and he says it typically runs in the 120-130 systolic range at home.  He has come off of dual antiplatelet therapy but remains on aspirin.  His last lipid profile was a year ago and he will be due for this again.  He is also on metoprolol.  He denies any need for nitroglycerin.  09/20/2020  Mr. Shirk is seen today in follow-up.  I saw him this past summer via a telemedicine visit and was having some fatigue and leg weakness.  I felt that his cholesterol had actually been very well treated in fact may be lower than he needed to.  I recommended decreasing his atorvastatin from 80 to 40 mg daily.  We just repeated his lipids which now  show a total cholesterol 115, HDL 43, LDL 55 and triglycerides 85.  He does note some mild improvement in his leg symptoms.  12/16/2021  Mr. Gibeault returns today for follow-up.  Overall he continues to do well.  He denies any chest pain or worsening shortness of breath.  He continues to work and is physically active.  EKG shows a normal sinus rhythm.  Blood pressure is well controlled today.  He denies any chest pain symptoms.  He does carry around nitroglycerin which he has not needed to use.  Recent lipids were reassessed in December showing total cholesterol 117, HDL 43, triglycerides 88 and LDL 57  12/16/2022  Mr. Braver is seen today  in follow-up.  He reports he continues to do well without chest pain or shortness of breath.  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  Blood pressure is a little elevated today however recently at his PCP in December his blood pressure is 123/75.  EKG is normal.  His last lipid profile showed an LDL of 51 in December.  12/31/2023  Mr. Purnell is seen today in follow-up.  Although he remains active he notes that over the past several years she has had some worsening shortness of breath.  He says he has to walk up 3 flights of stairs which he used to do very easily up to the roof of the building, however recently has had some difficulty doing that.  He thinks it might be just getting older although he notes that he has had to stop.  He denies any chest pain but notes that he did not really have any chest pain prior to his coronary intervention about 6 years ago.  Blood pressure is little elevated today at 144/78.  He had recent lipids showing total cholesterol 117, HDL 44, triglycerides 92 and LDL 56.  Past Medical History:  Diagnosis Date   CAD (coronary artery disease)    a. 10/2017: s/p NSTEMI with 2 overlapping DES to mid-LAD.    HTN (hypertension) 11/01/2017   Hypercholesterolemia    Hyperlipidemia     Past Surgical History:  Procedure Laterality Date   CORONARY STENT INTERVENTION N/A 11/02/2017   Procedure: CORONARY STENT INTERVENTION;  Surgeon: Lyn Records, MD;  Location: [facility] INVASIVE CV LAB;  Service: Cardiovascular;  Laterality: N/A;   LEFT HEART CATH AND CORONARY ANGIOGRAPHY N/A 11/02/2017   Procedure: LEFT HEART CATH AND CORONARY ANGIOGRAPHY;  Surgeon: Lyn Records, MD;  Location: [facility] INVASIVE CV LAB;  Service: Cardiovascular;  Laterality: N/A;    Current Medications: Outpatient Medications Prior to Visit  Medication Sig Dispense Refill   ASPIRIN LOW DOSE 81 MG EC tablet TAKE 1 TABLET BY MOUTH EVERY DAY 90 tablet 3   atorvastatin (LIPITOR) 40 MG tablet TAKE 1 TABLET  BY MOUTH EVERYDAY AT 6 PM 90 tablet 1   metoprolol tartrate (LOPRESSOR) 25 MG tablet TAKE 0.5 TABLETS BY MOUTH 2 TIMES DAILY. 90 tablet 3   nitroGLYCERIN (NITROSTAT) 0.4 MG SL tablet Place 1 tablet (0.4 mg total) under the tongue every 5 (five) minutes as needed for chest pain. 25 tablet 3   No facility-administered medications prior to visit.     Allergies:   Patient has no known allergies.   Social History   Socioeconomic History   Marital status: Married    Spouse name: Not on file   Number of children: Not on file   Years of education: Not on file   Highest education level: Not on  file  Occupational History   Not on file  Tobacco Use   Smoking status: Never   Smokeless tobacco: Never  Substance and Sexual Activity   Alcohol use: No    Alcohol/week: 0.0 standard drinks of alcohol   Drug use: No   Sexual activity: Not on file  Other Topics Concern   Not on file  Social History Narrative   Not on file   Social Drivers of Health   Financial Resource Strain: Not on file  Food Insecurity: Not on file  Transportation Needs: Not on file  Physical Activity: Not on file  Stress: Not on file  Social Connections: Not on file     Family History:  The patient's family history includes Allergies in his daughter; Colon cancer in his father; Heart disease in his maternal grandfather.   Review of Systems:   Pertinent items noted in HPI and remainder of comprehensive ROS otherwise negative.   Physical Exam:    VS:  BP (!) 144/78 (Cuff Size: Normal)   Pulse 61   Ht 5\' 6"  (1.676 m)   Wt 170 lb (77.1 kg)   SpO2 97%   BMI 27.44 kg/m    General appearance: alert and no distress Neck: no carotid bruit, no JVD and thyroid not enlarged, symmetric, no tenderness/mass/nodules Lungs: clear to auscultation bilaterally Heart: regular rate and rhythm, S1, S2 normal, no murmur, click, rub or gallop Abdomen: soft, non-tender; bowel sounds normal; no masses,  no  organomegaly Extremities: extremities normal, atraumatic, no cyanosis or edema Pulses: 2+ and symmetric Skin: Skin color, texture, turgor normal. No rashes or lesions Neurologic: Grossly normal Psych: Pleasant   Wt Readings from Last 3 Encounters:  12/31/23 170 lb (77.1 kg)  05/28/23 170 lb (77.1 kg)  12/16/22 170 lb (77.1 kg)     Studies/Labs Reviewed:   EKG Interpretation Date/Time:  Friday December 31 2023 14:09:57 EST Ventricular Rate:  56 PR Interval:  140 QRS Duration:  90 QT Interval:  412 QTC Calculation: 397 R Axis:   -28  Text Interpretation: Sinus bradycardia Minimal voltage criteria for LVH, may be normal variant ( R in aVL ) Cannot rule out Anterior infarct (cited on or before 02-Nov-2017) When compared with ECG of 03-Nov-2017 05:15, Questionable change in initial forces of Septal leads T wave inversion no longer evident in Lateral leads Confirmed by Zoila Shutter [phone]) on 12/31/2023 2:19:08 PM    Recent Labs: 05/28/2023: ALT 24; BUN 12; Creatinine, Ser 0.92; Hemoglobin 15.6; Platelets 210; Potassium 3.7; Sodium 138   Lipid Panel    Component Value Date/Time   CHOL 115 09/10/2020 0809   TRIG 85 09/10/2020 0809   HDL 43 09/10/2020 0809   CHOLHDL 2.7 09/10/2020 0809   CHOLHDL 3.1 11/02/2017 0508   VLDL 19 11/02/2017 0508   LDLCALC 55 09/10/2020 0809    Cardiac Catheterization: 11/02/2017 Severe eccentric 99% mid LAD stenosis beyond which there is TIMI grade II flow.  The most severe lesion lies within a segment of 40% narrowing in the mid vessel.  The LAD is otherwise widely patent Widely patent left main. 50% segmental stenosis in the second obtuse marginal. Normal widely patent nondominant right coronary. Successful PCI of the mid LAD using overlapping synergy 16 x 3.5 and 8 x 3.5 drug-eluting stents postdilated to 3.75 mm in diameter.  99% stenosis reduced to 0% with TIMI grade III flow.   RECOMMENDATIONS:   Aspirin and Brilinta for at least 12  months. Aggressive risk factor modification: LDL  less than 70, screening for diabetes, blood pressure 130/85 mmHg or less, and others as clinically indicated. Eligible for discharge in a.m. if clinically stable.  Assessment:    Progressive dyspnea on exertion Coronary artery disease status post PCI to the LAD (10/2017) Hypertension Dyslipidemia  Plan:   Mr. Gomes has had some progressive dyspnea on exertion particular when walking upstairs.  I am concerned this could be an anginal equivalent.  He underwent cardiac catheterization with PCI in 2018 but has not had any ischemia evaluation since that time.  Will recommend an exercise Myoview stress test.  Because there is also concern about possible chronotropic incompetence, would advise doing the study on his beta-blocker (do not hold the beta-blocker prior to the procedure), to monitor his response to that.  If he is not able to reach target he could likely been converted over to [employer].  I will follow-up with those results and if abnormal he will need further evaluation, otherwise plan follow-up with me annually or sooner as necessary.  Chrystie Nose, MD, [facility], FACP  [place]  [facility]  Medical Director of the [facility]  [facility]ovascular [facility] Diplomate of the American Board of Clinical Lipidology Attending Cardiologist  Direct Dial: [phone]  Fax: [phone]  Website:  [email or web address]  Chrystie Nose, MD  12/31/2023 2:19 PM

## 2024-01-03 ENCOUNTER — Encounter (HOSPITAL_COMMUNITY): Payer: Self-pay

## 2024-01-05 ENCOUNTER — Other Ambulatory Visit: Payer: Self-pay | Admitting: Internal Medicine

## 2024-01-11 ENCOUNTER — Other Ambulatory Visit: Payer: Self-pay | Admitting: Internal Medicine

## 2024-01-11 ENCOUNTER — Ambulatory Visit (HOSPITAL_COMMUNITY): Payer: BC Managed Care – PPO | Attending: Internal Medicine

## 2024-01-11 DIAGNOSIS — I10 Essential (primary) hypertension: Secondary | ICD-10-CM | POA: Diagnosis not present

## 2024-01-11 DIAGNOSIS — R0602 Shortness of breath: Secondary | ICD-10-CM

## 2024-01-11 DIAGNOSIS — E785 Hyperlipidemia, unspecified: Secondary | ICD-10-CM | POA: Diagnosis not present

## 2024-01-11 DIAGNOSIS — I2510 Atherosclerotic heart disease of native coronary artery without angina pectoris: Secondary | ICD-10-CM

## 2024-01-11 LAB — MYOCARDIAL PERFUSION IMAGING
Angina Index: 0
LV dias vol: 51 mL (ref 62–150)
LV sys vol: 17 mL
Nuc Stress EF: 66 %
Peak HR: 155 {beats}/min
Rest HR: 100 {beats}/min
Rest Nuclear Isotope Dose: 10.5 mCi
Rest Nuclear Isotope Dose: 10.5 mCi
SDS: 0
SRS: 0
SSS: 0
ST Depression (mm): 1 mm
Stress Nuclear Isotope Dose: 29.8 mCi
Stress Nuclear Isotope Dose: 29.8 mCi
TID: 0.78

## 2024-01-11 MED ORDER — TECHNETIUM TC 99M TETROFOSMIN IV KIT
29.8000 | PACK | Freq: Once | INTRAVENOUS | Status: AC | PRN
  Administered 2024-01-11: 29.8 via INTRAVENOUS

## 2024-01-11 MED ORDER — TECHNETIUM TC 99M TETROFOSMIN IV KIT
10.5000 | PACK | Freq: Once | INTRAVENOUS | Status: AC | PRN
  Administered 2024-01-11: 10.5 via INTRAVENOUS

## 2024-02-21 IMAGING — DX DG KNEE COMPLETE 4+V*R*
4 series · 4 of 4 positions shown · non-contrast
Comparison: None Available.

CLINICAL DATA: Injury pain

EXAM:
RIGHT KNEE - COMPLETE 4+ VIEW

[knee ap]
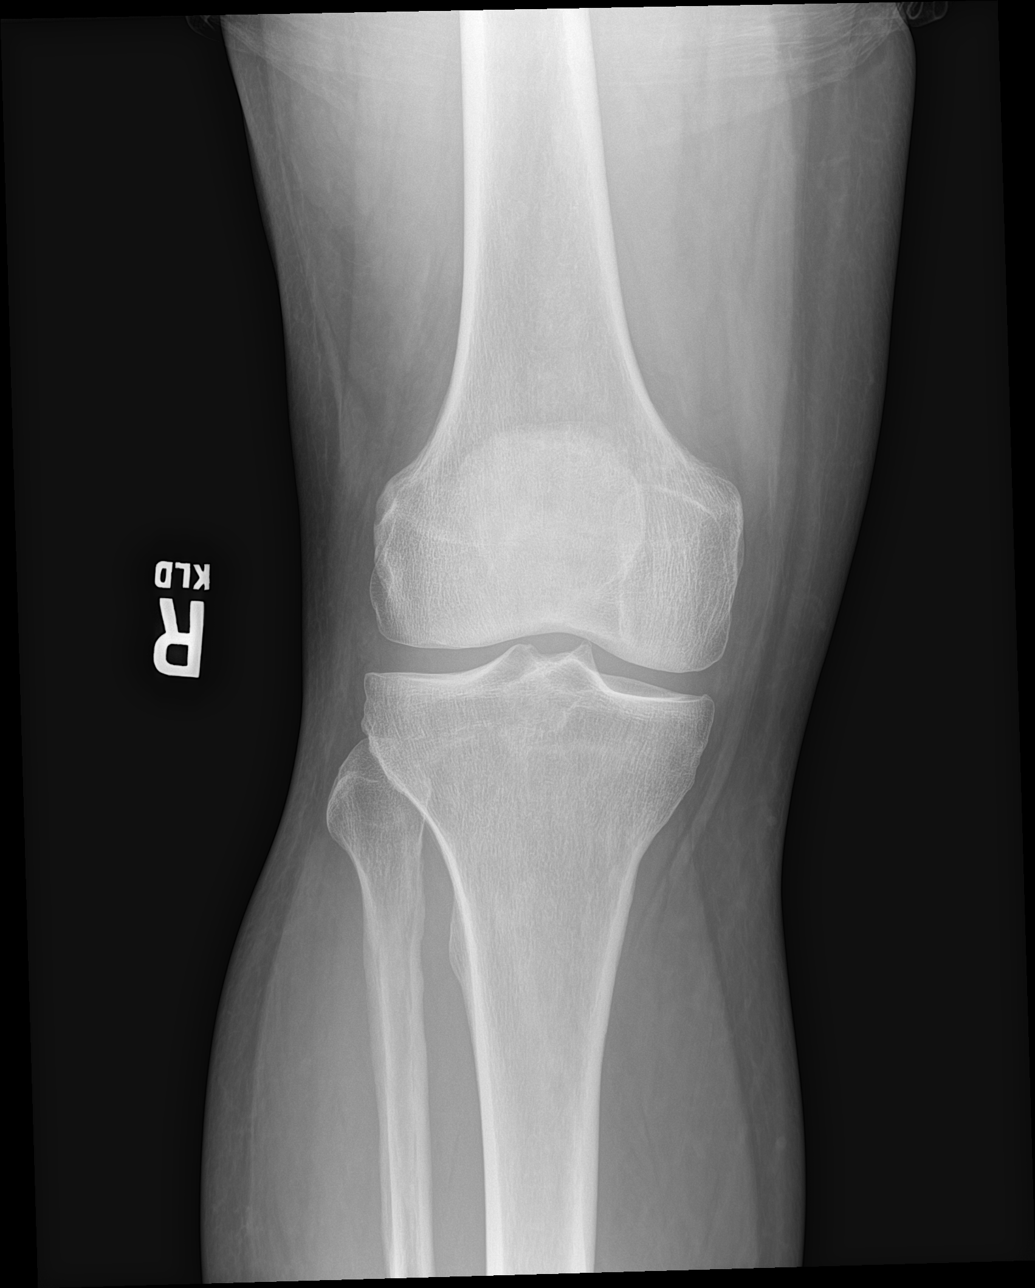

[knee obl (1 of 2)]
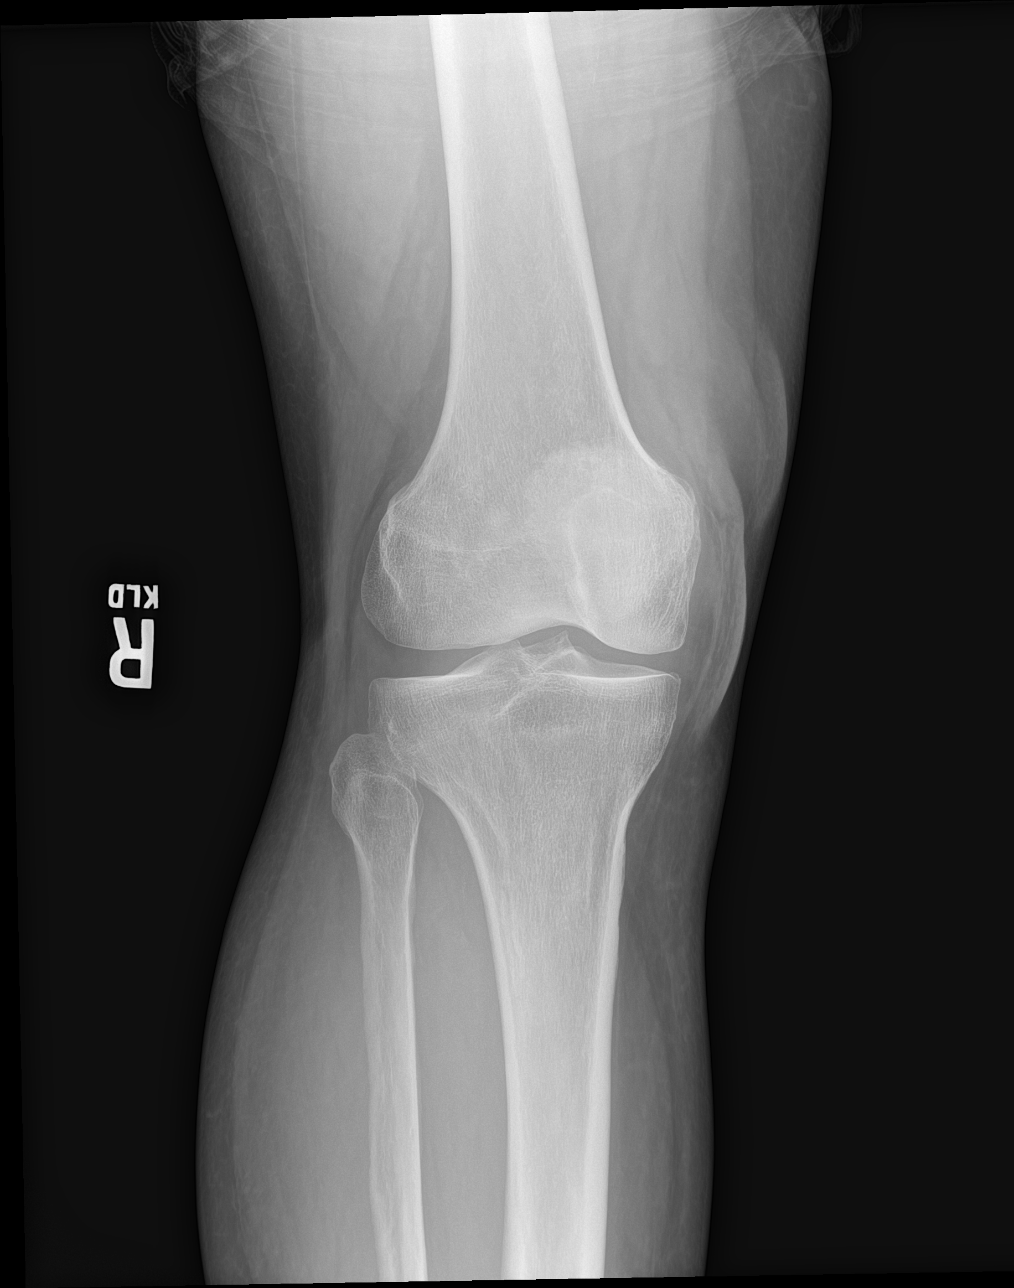

[knee obl (2 of 2)]
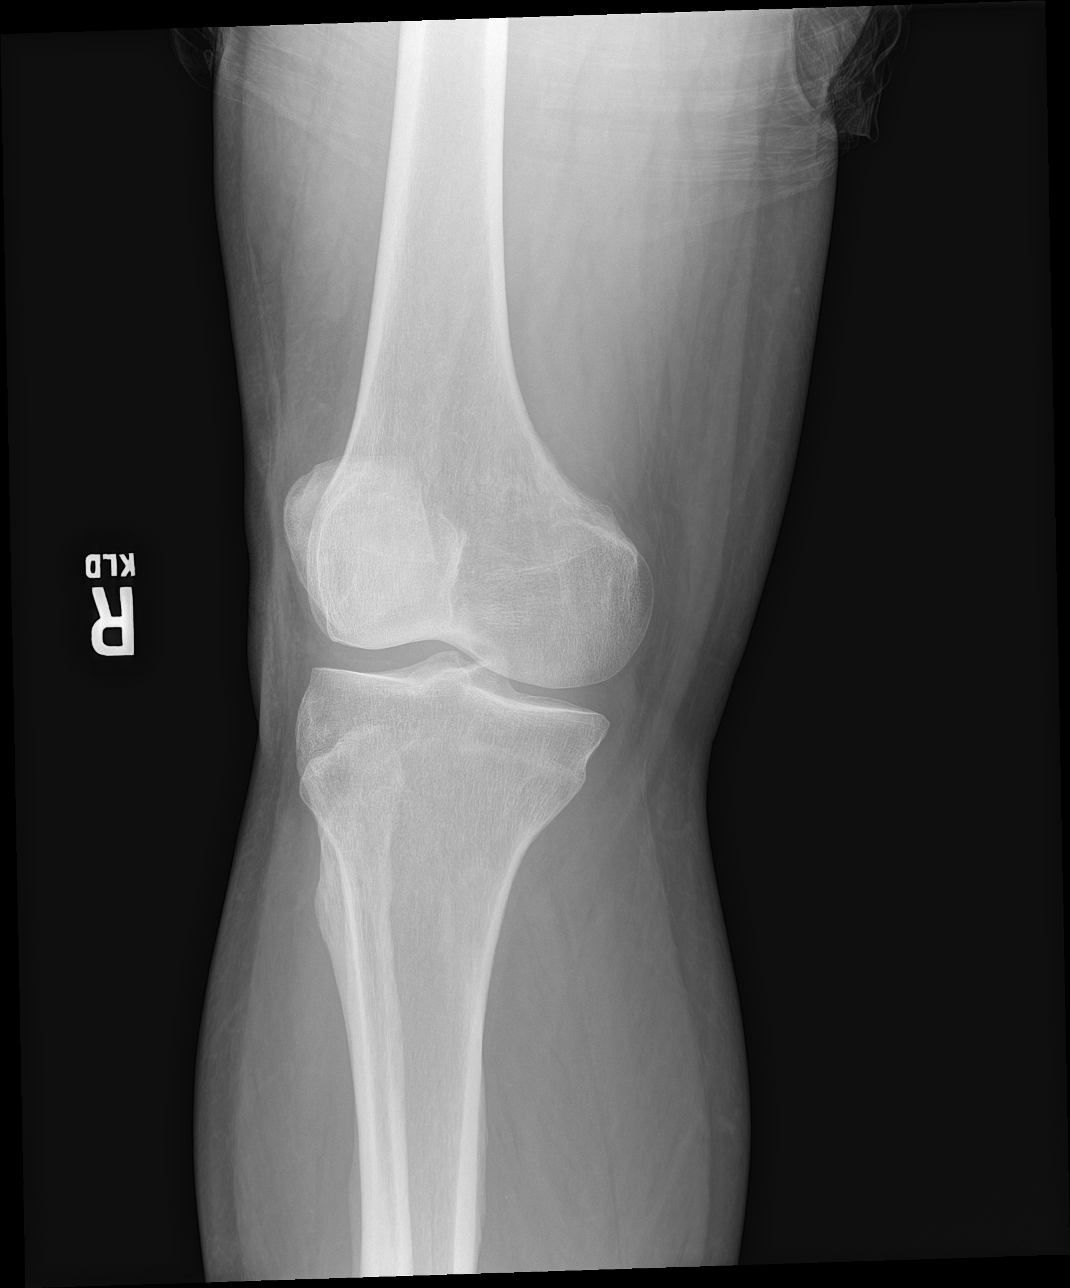

[knee lat]
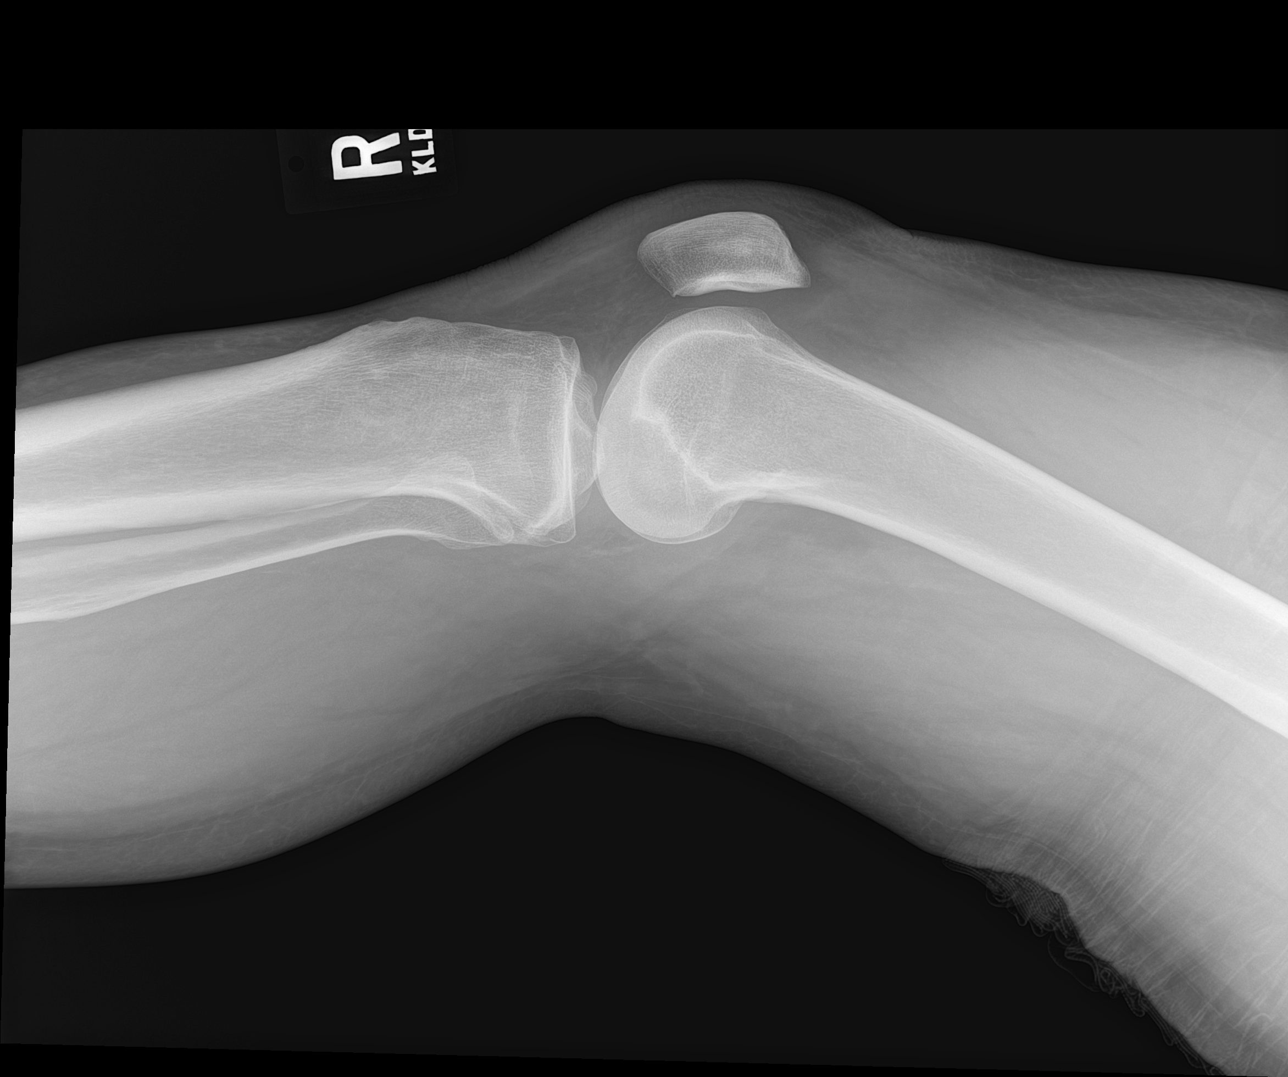

[4 of 4 positions shown; findings below may reference images not displayed]

FINDINGS: No evidence of fracture, dislocation, or joint effusion. No evidence
of arthropathy or other focal bone abnormality. Soft tissues are
unremarkable.
IMPRESSION: Negative.

## 2024-04-13 DIAGNOSIS — H40013 Open angle with borderline findings, low risk, bilateral: Secondary | ICD-10-CM | POA: Diagnosis not present

## 2024-08-11 DIAGNOSIS — R21 Rash and other nonspecific skin eruption: Secondary | ICD-10-CM | POA: Diagnosis not present

## 2024-10-16 DIAGNOSIS — H40013 Open angle with borderline findings, low risk, bilateral: Secondary | ICD-10-CM | POA: Diagnosis not present

## 2025-01-01 ENCOUNTER — Other Ambulatory Visit: Payer: Self-pay | Admitting: Internal Medicine

## 2025-01-03 NOTE — Telephone Encounter (Signed)
 Pt can get Labs on 01/17/25 O/V

## 2025-01-17 ENCOUNTER — Ambulatory Visit: Admitting: Internal Medicine
